# Patient Record
Sex: Female | Born: 1985 | Race: White | Hispanic: No | Marital: Married | Smoking: Never smoker
Health system: Southern US, Community
[De-identification: ages and names within clinical notes are randomized; demographics above are authoritative.]

## PROBLEM LIST (undated history)

## (undated) ENCOUNTER — Emergency Department (HOSPITAL_COMMUNITY): Admission: EM | Payer: Self-pay | Source: Home / Self Care

## (undated) DIAGNOSIS — L0232 Furuncle of buttock: Principal | ICD-10-CM

## (undated) DIAGNOSIS — K831 Obstruction of bile duct: Secondary | ICD-10-CM

## (undated) DIAGNOSIS — Z309 Encounter for contraceptive management, unspecified: Secondary | ICD-10-CM

## (undated) DIAGNOSIS — E785 Hyperlipidemia, unspecified: Secondary | ICD-10-CM

## (undated) DIAGNOSIS — O26643 Intrahepatic cholestasis of pregnancy, third trimester: Secondary | ICD-10-CM

## (undated) HISTORY — DX: Obstruction of bile duct: K83.1

## (undated) HISTORY — DX: Hyperlipidemia, unspecified: E78.5

## (undated) HISTORY — DX: Furuncle of buttock: L02.32

## (undated) HISTORY — DX: Encounter for contraceptive management, unspecified: Z30.9

## (undated) HISTORY — DX: Intrahepatic cholestasis of pregnancy, third trimester: O26.643

## (undated) HISTORY — PX: WISDOM TOOTH EXTRACTION: SHX21

---

## 2012-05-06 ENCOUNTER — Ambulatory Visit (HOSPITAL_COMMUNITY)
Admission: RE | Admit: 2012-05-06 | Discharge: 2012-05-06 | Disposition: A | Discharge status: Home / Self Care | Payer: PRIVATE HEALTH INSURANCE | Source: Ambulatory Visit | Attending: Emergency Medicine | Admitting: Emergency Medicine

## 2012-05-06 ENCOUNTER — Ambulatory Visit (HOSPITAL_COMMUNITY)
Admission: AD | Admit: 2012-05-06 | Discharge: 2012-05-06 | Disposition: A | Discharge status: Home / Self Care | Payer: PRIVATE HEALTH INSURANCE | Source: Ambulatory Visit | Attending: Emergency Medicine | Admitting: Emergency Medicine

## 2012-05-06 ENCOUNTER — Encounter (HOSPITAL_COMMUNITY): Payer: Self-pay | Admitting: *Deleted

## 2012-05-06 ENCOUNTER — Ambulatory Visit (INDEPENDENT_AMBULATORY_CARE_PROVIDER_SITE_OTHER): Payer: PRIVATE HEALTH INSURANCE | Admitting: Emergency Medicine

## 2012-05-06 ENCOUNTER — Encounter (HOSPITAL_COMMUNITY): Payer: Self-pay

## 2012-05-06 VITALS — BP 129/79 | HR 93 | Temp 98.3°F | Resp 18 | Ht 65.5 in | Wt 149.0 lb

## 2012-05-06 DIAGNOSIS — R1031 Right lower quadrant pain: Secondary | ICD-10-CM | POA: Insufficient documentation

## 2012-05-06 DIAGNOSIS — N949 Unspecified condition associated with female genital organs and menstrual cycle: Secondary | ICD-10-CM

## 2012-05-06 DIAGNOSIS — R102 Pelvic and perineal pain: Secondary | ICD-10-CM

## 2012-05-06 LAB — POCT CBC
Hemoglobin: 13.7 g/dL (ref 12.2–16.2)
MCHC: 32.2 g/dL (ref 31.8–35.4)
MID (cbc): 1.7 — AB (ref 0–0.9)
MPV: 10.5 fL (ref 0–99.8)
POC Granulocyte: 11.9 — AB (ref 2–6.9)
POC MID %: 10.6 %M (ref 0–12)
Platelet Count, POC: 296 10*3/uL (ref 142–424)
RBC: 4.57 M/uL (ref 4.04–5.48)

## 2012-05-06 LAB — POCT URINALYSIS DIPSTICK
Blood, UA: NEGATIVE
Ketones, UA: NEGATIVE
Protein, UA: NEGATIVE
Spec Grav, UA: <=1.005
Urobilinogen, UA: 0.2

## 2012-05-06 LAB — POCT UA - MICROSCOPIC ONLY
Bacteria, U Microscopic: NEGATIVE
Casts, Ur, LPF, POC: NEGATIVE
Crystals, Ur, HPF, POC: NEGATIVE
Mucus, UA: NEGATIVE

## 2012-05-06 MED ORDER — IOHEXOL 300 MG/ML  SOLN: 50.0000 mL | Freq: Once | INTRAMUSCULAR | Status: AC | PRN

## 2012-05-06 MED ORDER — IOHEXOL 300 MG/ML  SOLN
100.0000 mL | Freq: Once | INTRAMUSCULAR | Status: AC | PRN
  Administered 2012-05-06: 100 mL via INTRAVENOUS

## 2012-05-06 NOTE — ED Notes (Signed)
Pt states abdominal pain began 130 pm today. Pain in lower right abdominal quadrant. Described as sharp. Denies difficulty voiding, defecating or vaginal discharge. LMP 3 weeks ago. Last BM this morning.

## 2012-05-06 NOTE — Progress Notes (Signed)
[facility] [address], [place] [address] [phone]  Date:  05/06/2012   Name:  Dawn Perez   DOB:  11-22-1985   MRN:  [id number]  PCP:  No primary provider on file.    Chief Complaint: Abdominal Pain   History of Present Illness:  Dawn Perez is a 27 y.o. very pleasant female patient who presents with the following:  Jogging around 1300 and developed sharp pain in her Right lower quadrant that was severe enough that she walked back the 1 1/2 miles to her apartment.  No loss of appetite. No fever or chills.  No nausea or vomiting.  Had some loose stools this morning.  No blood mucous or pus in stools.  No dysuria urgency or frequency.  No vaginal discharge or bleeding.  History of ovarian cysts when teenager and not on OCP.  Pain is less now but persists.  Pain started over umbilical area and is now in the RLQ  There is no problem list on file for this patient.   Past Medical History  Diagnosis Date  . Hyperlipidemia     History reviewed. No pertinent past surgical history.  History  Substance Use Topics  . Smoking status: Never Smoker   . Smokeless tobacco: Not on file  . Alcohol Use: 0.5 oz/week    1 drink(s) per week    Family History  Problem Relation Age of Onset  . Hypertension Mother   . Hyperlipidemia Mother     No Known Allergies  Medication list has been reviewed and updated.  No current outpatient prescriptions on file prior to visit.   No current facility-administered medications on file prior to visit.    Review of Systems:  As per HPI, otherwise negative.    Physical Examination: Filed Vitals:   05/06/12 1746  BP: 129/79  Pulse: 93  Temp: 98.3 F (36.8 C)  Resp: 18   Filed Vitals:   05/06/12 1746  Height: 5' 5.5" (1.664 m)  Weight: 149 lb (67.586 kg)   Body mass index is 24.41 kg/(m^2). Ideal Body Weight: Weight in (lb) to have BMI = 25: 152.2  GEN: WDWN, NAD, Non-toxic, A & O x 3 HEENT:  Atraumatic, Normocephalic. Neck supple. No masses, No LAD. Ears and Nose: No external deformity. CV: RRR, No M/G/R. No JVD. No thrill. No extra heart sounds. PULM: CTA B, no wheezes, crackles, rhonchi. No retractions. No resp. distress. No accessory muscle use. ABD: S, tender right lower quadrant with minimal guarding, ND, +BS. No rebound. No HSM. EXTR: No c/c/e NEURO Normal gait.  PSYCH: Normally interactive. Conversant. Not depressed or anxious appearing.  Calm demeanor.   Pelvic - normal external genitalia, vulva, vagina, cervix, uterus and adnexa   Assessment and Plan: RLQ abdominal pain r/o appendicitis CT scan  Carmelina Dane, MD  Results for orders placed in visit on 05/06/12  POCT UA - MICROSCOPIC ONLY      Result Value Range   WBC, Ur, HPF, POC neg     RBC, urine, microscopic neg     Bacteria, U Microscopic neg     Mucus, UA neg     Epithelial cells, urine per micros 0-2     Crystals, Ur, HPF, POC neg     Casts, Ur, LPF, POC neg     Yeast, UA neg    POCT URINALYSIS DIPSTICK      Result Value Range   Color, UA yellow     Clarity,  UA clear     Glucose, UA neg     Bilirubin, UA neg     Ketones, UA neg     Spec Grav, UA <=1.005     Blood, UA neg     pH, UA 6.0     Protein, UA neg     Urobilinogen, UA 0.2     Nitrite, UA neg     Leukocytes, UA Negative    POCT CBC      Result Value Range   WBC 16.5 (*) 4.6 - 10.2 K/uL   Lymph, poc 2.9  0.6 - 3.4   POC LYMPH PERCENT 17.4  10 - 50 %L   MID (cbc) 1.7 (*) 0 - 0.9   POC MID % 10.6  0 - 12 %M   POC Granulocyte 11.9 (*) 2 - 6.9   Granulocyte percent 72.0  37 - 80 %G   RBC 4.57  4.04 - 5.48 M/uL   Hemoglobin 13.7  12.2 - 16.2 g/dL   HCT, POC 19.1  47.8 - 47.9 %   MCV 93.3  80 - 97 fL   MCH, POC 30.0  27 - 31.2 pg   MCHC 32.2  31.8 - 35.4 g/dL   RDW, POC 29.5     Platelet Count, POC 296  142 - 424 K/uL   MPV 10.5  0 - 99.8 fL

## 2012-05-06 NOTE — ED Notes (Signed)
Pt seen at urgent care. They checked CBC and found elevated wbc. And performed pelvic exam.

## 2012-05-06 NOTE — Addendum Note (Signed)
Addended by: Carollee Leitz L on: 05/06/2012 07:05 PM   Modules accepted: Orders

## 2012-05-07 ENCOUNTER — Telehealth: Payer: Self-pay | Admitting: *Deleted

## 2012-05-07 ENCOUNTER — Ambulatory Visit (HOSPITAL_COMMUNITY)
Admission: RE | Admit: 2012-05-07 | Discharge: 2012-05-07 | Disposition: A | Discharge status: Home / Self Care | Payer: PRIVATE HEALTH INSURANCE | Source: Ambulatory Visit | Attending: Emergency Medicine | Admitting: Emergency Medicine

## 2012-05-07 ENCOUNTER — Ambulatory Visit (INDEPENDENT_AMBULATORY_CARE_PROVIDER_SITE_OTHER): Payer: PRIVATE HEALTH INSURANCE | Admitting: Emergency Medicine

## 2012-05-07 VITALS — BP 116/80 | HR 62 | Temp 98.4°F | Resp 14 | Ht 66.0 in | Wt 148.2 lb

## 2012-05-07 DIAGNOSIS — R109 Unspecified abdominal pain: Secondary | ICD-10-CM

## 2012-05-07 DIAGNOSIS — N949 Unspecified condition associated with female genital organs and menstrual cycle: Secondary | ICD-10-CM | POA: Insufficient documentation

## 2012-05-07 LAB — POCT CBC
HCT, POC: 43.4 % (ref 37.7–47.9)
Hemoglobin: 13.9 g/dL (ref 12.2–16.2)
Lymph, poc: 2.2 (ref 0.6–3.4)
MCHC: 32 g/dL (ref 31.8–35.4)
MPV: 10.1 fL (ref 0–99.8)
POC Granulocyte: 6.1 (ref 2–6.9)
POC MID %: 15.9 %M — AB (ref 0–12)
RBC: 4.69 M/uL (ref 4.04–5.48)
WBC: 9.8 10*3/uL (ref 4.6–10.2)

## 2012-05-07 NOTE — Patient Instructions (Addendum)
March 6th thursday 8:40 call 24 hrs in advance if patient needs to cancel [address]

## 2012-05-07 NOTE — Telephone Encounter (Signed)
Called patient to advise her the Ultrasound was normal and if she does not get better return to the clinic. Also, keep the appointment with Dr Audie Box on Thursday, per Dr Dareen Piano.

## 2012-05-07 NOTE — Progress Notes (Signed)
[facility] [address] [place] [address] [phone]  Date:  05/07/2012   Name:  Dawn Perez   DOB:  03/21/1985   MRN:  [id number]  PCP:  No primary provider on file.    Chief Complaint: Abdominal Pain   History of Present Illness:  Dawn Perez is a 27 y.o. very pleasant female patient who presents with the following:  Jogging around 1300 yesterday and developed sharp pain in her Right lower quadrant that was severe enough that she walked back the 1 1/2 miles to her apartment. No loss of appetite. No fever or chills. No nausea or vomiting. Had some loose stools this morning. No blood mucous or pus in stools. No dysuria urgency or frequency. No vaginal discharge or bleeding. History of ovarian cysts when teenager and not on OCP. Pain is less now today but persists. Pain started over umbilical area and is now in the RLQ.  Had a negative pelvic and a negative CT abdomen and pelvis.     There is no problem list on file for this patient.   Past Medical History  Diagnosis Date  . Hyperlipidemia     History reviewed. No pertinent past surgical history.  History  Substance Use Topics  . Smoking status: Never Smoker   . Smokeless tobacco: Not on file  . Alcohol Use: 0.5 oz/week    1 drink(s) per week    Family History  Problem Relation Age of Onset  . Hypertension Mother   . Hyperlipidemia Mother     No Known Allergies  Medication list has been reviewed and updated.  Current Outpatient Prescriptions on File Prior to Visit  Medication Sig Dispense Refill  . norgestimate-ethinyl estradiol (ORTHO-CYCLEN,SPRINTEC,PREVIFEM) 0.25-35 MG-MCG tablet Take 1 tablet by mouth daily.       No current facility-administered medications on file prior to visit.    Review of Systems:  As per HPI, otherwise negative.    Physical Examination: Filed Vitals:   05/07/12 1225  BP: 116/80  Pulse: 62  Temp: 98.4 F (36.9 C)  Resp: 14   Filed Vitals:   05/07/12 1225  Height: 5\' 6"  (1.676 m)  Weight: 148 lb 3.2 oz (67.223 kg)   Body mass index is 23.93 kg/(m^2). Ideal Body Weight: Weight in (lb) to have BMI = 25: 154.6  GEN: WDWN, NAD, Non-toxic, A & O x 3 HEENT: Atraumatic, Normocephalic. Neck supple. No masses, No LAD. Ears and Nose: No external deformity. CV: RRR, No M/G/R. No JVD. No thrill. No extra heart sounds. PULM: CTA B, no wheezes, crackles, rhonchi. No retractions. No resp. distress. No accessory muscle use. ABD: S, tender right lower quadrant, ND, +BS. No rebound. No HSM. EXTR: No c/c/e NEURO Normal gait.  PSYCH: Normally interactive. Conversant. Not depressed or anxious appearing.  Calm demeanor.    Assessment and Plan: Abdominal pain  Carmelina Dane, MD  Results for orders placed in visit on 05/07/12  POCT CBC      Result Value Range   WBC 9.8  4.6 - 10.2 K/uL   Lymph, poc 2.2  0.6 - 3.4   POC LYMPH PERCENT 22.1  10 - 50 %L   MID (cbc) 1.6 (*) 0 - 0.9   POC MID % 15.9 (*) 0 - 12 %M   POC Granulocyte 6.1  2 - 6.9   Granulocyte percent 62.0  37 - 80 %G   RBC 4.69  4.04 - 5.48 M/uL   Hemoglobin 13.9  12.2 - 16.2 g/dL   HCT, POC 16.1  09.6 - 47.9 %   MCV 92.6  80 - 97 fL   MCH, POC 29.6  27 - 31.2 pg   MCHC 32.0  31.8 - 35.4 g/dL   RDW, POC 04.5     Platelet Count, POC 299  142 - 424 K/uL   MPV 10.1  0 - 99.8 fL

## 2012-05-08 ENCOUNTER — Telehealth: Payer: Self-pay | Admitting: Family Medicine

## 2012-05-08 LAB — GC/CHLAMYDIA PROBE AMP: CT Probe RNA: NEGATIVE

## 2012-05-10 ENCOUNTER — Ambulatory Visit: Payer: PRIVATE HEALTH INSURANCE | Admitting: Gynecology

## 2012-07-24 NOTE — Telephone Encounter (Signed)
close

## 2013-04-08 ENCOUNTER — Encounter: Payer: Self-pay | Admitting: Adult Health

## 2013-04-08 ENCOUNTER — Other Ambulatory Visit (HOSPITAL_COMMUNITY)
Admission: RE | Admit: 2013-04-08 | Discharge: 2013-04-08 | Disposition: A | Discharge status: Home / Self Care | Payer: PRIVATE HEALTH INSURANCE | Source: Ambulatory Visit | Attending: Obstetrics and Gynecology | Admitting: Obstetrics and Gynecology

## 2013-04-08 ENCOUNTER — Ambulatory Visit (INDEPENDENT_AMBULATORY_CARE_PROVIDER_SITE_OTHER): Payer: PRIVATE HEALTH INSURANCE | Admitting: Adult Health

## 2013-04-08 ENCOUNTER — Encounter (INDEPENDENT_AMBULATORY_CARE_PROVIDER_SITE_OTHER): Payer: Self-pay

## 2013-04-08 VITALS — BP 120/72 | HR 72 | Ht 66.0 in | Wt 153.0 lb

## 2013-04-08 DIAGNOSIS — Z309 Encounter for contraceptive management, unspecified: Secondary | ICD-10-CM | POA: Insufficient documentation

## 2013-04-08 DIAGNOSIS — Z01419 Encounter for gynecological examination (general) (routine) without abnormal findings: Secondary | ICD-10-CM

## 2013-04-08 DIAGNOSIS — Z3200 Encounter for pregnancy test, result unknown: Secondary | ICD-10-CM

## 2013-04-08 DIAGNOSIS — Z3202 Encounter for pregnancy test, result negative: Secondary | ICD-10-CM

## 2013-04-08 LAB — POCT URINE PREGNANCY: PREG TEST UR: NEGATIVE

## 2013-04-08 MED ORDER — NORETHIN-ETH ESTRAD-FE BIPHAS 1 MG-10 MCG / 10 MCG PO TABS: 1.0000 | ORAL_TABLET | Freq: Every day | ORAL | Status: DC

## 2013-04-08 NOTE — Progress Notes (Signed)
[name], female   DOB: 01/28/1986, 28 y.o.   MRN: [id number] History of Present Illness:  Dawn Perez is a 28 year old white female single in for a pap and physical.wnats to try some birth control, has ben using condoms.  Current Medications, Allergies, Past Medical History, Past Surgical History, Family History and Social History were reviewed in [facility] Link electronic medical record.     Review of Systems: Patient denies any headaches, blurred vision, shortness of breath, chest pain, abdominal pain, problems with bowel movements, urination, or intercourse. No joint pain or mood swings.     Physical Exam:BP 120/72  Pulse 72  Ht 5\' 6"  (1.676 m)  Wt 153 lb (69.4 kg)  BMI 24.71 kg/m2  LMP 01/15/2015UPT negative General:  Well developed, well nourished, no acute distress Skin:  Warm and dry Neck:  Midline trachea, normal thyroid Lungs; Clear to auscultation bilaterally Breast:  No dominant palpable mass, retraction, or nipple discharge Cardiovascular: Regular rate and rhythm Abdomen:  Soft, non tender, no hepatosplenomegaly Pelvic:  External genitalia is normal in appearance.  The vagina is normal in appearance.  The cervix is smooth.  Uterus is felt to be normal size, shape, and contour.  No adnexal masses or tenderness noted. 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   Impression: Yearly gyn exam Contraceptive management History of elevated cholesterol   Plan: Trial  Loloestin Number of samples 4 Lot number [id number]   Exp date 1/16 Start with next cycle, use condoms Follow up in 3 months and will get fasting labs Physical in 1 year

## 2013-04-08 NOTE — Patient Instructions (Signed)
Try lo loestrin  Oral Contraception Use Oral contraceptive pills (OCPs) are medicines taken to prevent pregnancy. OCPs work by preventing the ovaries from releasing eggs. The hormones in OCPs also cause the cervical mucus to thicken, preventing the sperm from entering the uterus. The hormones also cause the uterine lining to become thin, not allowing a fertilized egg to attach to the inside of the uterus. OCPs are highly effective when taken exactly as prescribed. However, OCPs do not prevent sexually transmitted diseases (STDs). Safe sex practices, such as using condoms along with an OCP, can help prevent STDs. Before taking OCPs, you may have a physical exam and Pap test. Your health care provider may also order blood tests if necessary. Your health care provider will make sure you are a good candidate for oral contraception. Discuss with your health care provider the possible side effects of the OCP you may be prescribed. When starting an OCP, it can take 2 to 3 months for the body to adjust to the changes in hormone levels in your body.  HOW TO TAKE ORAL CONTRACEPTIVE PILLS Your health care provider may advise you on how to start taking the first cycle of OCPs. Otherwise, you can:   Start on day 1 of your menstrual period. You will not need any backup contraceptive protection with this start time.   Start on the first Sunday after your menstrual period or the day you get your prescription. In these cases, you will need to use backup contraceptive protection for the first week.   Start the pill at any time of your cycle. If you take the pill within 5 days of the start of your period, you are protected against pregnancy right away. In this case, you will not need a backup form of birth control. If you start at any other time of your menstrual cycle, you will need to use another form of birth control for 7 days. If your OCP is the type called a minipill, it will protect you from pregnancy after taking  it for 2 days (48 hours). After you have started taking OCPs:   If you forget to take 1 pill, take it as soon as you remember. Take the next pill at the regular time.   If you miss 2 or more pills, call your health care provider because different pills have different instructions for missed doses. Use backup birth control until your next menstrual period starts.   If you use a 28-day pack that contains inactive pills and you miss 1 of the last 7 pills (pills with no hormones), it will not matter. Throw away the rest of the nonhormone pills and start a new pill pack.  No matter which day you start the OCP, you will always start a new pack on that same day of the week. Have an extra pack of OCPs and a backup contraceptive method available in case you miss some pills or lose your OCP pack.  HOME CARE INSTRUCTIONS   Do not smoke.   Always use a condom to protect against STDs. OCPs do not protect against STDs.   Use a calendar to mark your menstrual period days.   Read the information and directions that came with your OCP. Talk to your health care provider if you have questions.  SEEK MEDICAL CARE IF:   You develop nausea and vomiting.   You have abnormal vaginal discharge or bleeding.   You develop a rash.   You miss your menstrual period.  You are losing your hair.   You need treatment for mood swings or depression.   You get dizzy when taking the OCP.   You develop acne from taking the OCP.   You become pregnant.  SEEK IMMEDIATE MEDICAL CARE IF:   You develop chest pain.   You develop shortness of breath.   You have an uncontrolled or severe headache.   You develop numbness or slurred speech.   You develop visual problems.   You develop pain, redness, and swelling in the legs.  Document Released: 02/10/2011 Document Revised: 10/24/2012 Document Reviewed: 08/12/2012 [facility] Patient Information 2014 [place], [place]. Follow up in 3 months for  F/U and labs

## 2013-05-23 ENCOUNTER — Ambulatory Visit: Payer: PRIVATE HEALTH INSURANCE | Admitting: Adult Health

## 2013-05-23 ENCOUNTER — Telehealth: Payer: Self-pay | Admitting: Adult Health

## 2013-05-23 NOTE — Telephone Encounter (Signed)
Pt informed WNL pap from 04/08/2013. Pt c/o "bump" vaginal area, called transferred to front staff for an appt.

## 2013-05-29 ENCOUNTER — Encounter: Payer: Self-pay | Admitting: Advanced Practice Midwife

## 2013-05-29 ENCOUNTER — Ambulatory Visit (INDEPENDENT_AMBULATORY_CARE_PROVIDER_SITE_OTHER): Payer: PRIVATE HEALTH INSURANCE | Admitting: Advanced Practice Midwife

## 2013-05-29 ENCOUNTER — Encounter (INDEPENDENT_AMBULATORY_CARE_PROVIDER_SITE_OTHER): Payer: Self-pay

## 2013-05-29 VITALS — BP 100/70 | Ht 66.0 in | Wt 154.0 lb

## 2013-05-29 DIAGNOSIS — N764 Abscess of vulva: Secondary | ICD-10-CM

## 2013-05-29 NOTE — Progress Notes (Signed)
Dawn PaisBethany Perez 28 y.o.  Filed Vitals:   05/29/13 1613  BP: 100/70   Past Medical History  Diagnosis Date  . Hyperlipidemia   . Contraceptive management 04/08/2013   History reviewed. No pertinent past surgical history. family history includes Autoimmune disease (age of onset: 5630) in her father; Cancer in her other; Heart disease in her maternal grandfather; Hyperlipidemia in her mother; Hypertension in her mother; Stroke in her maternal grandfather. Current outpatient prescriptions:Norethindrone-Ethinyl Estradiol-Fe Biphas (LO LOESTRIN FE) 1 MG-10 MCG / 10 MCG tablet, Take 1 tablet by mouth daily., Disp: 4 Package, Rfl: 0  HPI:  Started on LoLestrin a month ago.  Fine so far.  Last week noticed a bump on vagina.  It came to a head, white pus came out of it, and now it's gone.  PE: No evidence of furnuncle or otherwise ASSESSMENT:probably furuncle, resolved  PLAN:dicussed loofa, dial soap

## 2013-07-09 ENCOUNTER — Ambulatory Visit (INDEPENDENT_AMBULATORY_CARE_PROVIDER_SITE_OTHER): Payer: PRIVATE HEALTH INSURANCE | Admitting: Adult Health

## 2013-07-09 ENCOUNTER — Encounter: Payer: Self-pay | Admitting: Adult Health

## 2013-07-09 VITALS — BP 110/72 | Ht 66.0 in | Wt 157.0 lb

## 2013-07-09 DIAGNOSIS — Z139 Encounter for screening, unspecified: Secondary | ICD-10-CM

## 2013-07-09 DIAGNOSIS — Z3009 Encounter for other general counseling and advice on contraception: Secondary | ICD-10-CM

## 2013-07-09 DIAGNOSIS — Z309 Encounter for contraceptive management, unspecified: Secondary | ICD-10-CM

## 2013-07-09 LAB — COMPREHENSIVE METABOLIC PANEL
ALT: 11 U/L (ref 0–35)
AST: 15 U/L (ref 0–37)
Albumin: 4.1 g/dL (ref 3.5–5.2)
Alkaline Phosphatase: 58 U/L (ref 39–117)
BILIRUBIN TOTAL: 0.4 mg/dL (ref 0.2–1.2)
BUN: 16 mg/dL (ref 6–23)
CO2: 25 mEq/L (ref 19–32)
Calcium: 9.7 mg/dL (ref 8.4–10.5)
Chloride: 105 mEq/L (ref 96–112)
Creat: 0.98 mg/dL (ref 0.50–1.10)
GLUCOSE: 69 mg/dL — AB (ref 70–99)
Potassium: 4.3 mEq/L (ref 3.5–5.3)
Sodium: 137 mEq/L (ref 135–145)
Total Protein: 6.9 g/dL (ref 6.0–8.3)

## 2013-07-09 LAB — LIPID PANEL
CHOL/HDL RATIO: 2.3 ratio
CHOLESTEROL: 177 mg/dL (ref 0–200)
HDL: 77 mg/dL (ref >39)
LDL Cholesterol: 89 mg/dL (ref 0–99)
Triglycerides: 54 mg/dL (ref <150)
VLDL: 11 mg/dL (ref 0–40)

## 2013-07-09 LAB — CBC
HCT: 39.7 % (ref 36.0–46.0)
HEMOGLOBIN: 13.5 g/dL (ref 12.0–15.0)
MCH: 30.8 pg (ref 26.0–34.0)
MCHC: 34 g/dL (ref 30.0–36.0)
MCV: 90.6 fL (ref 78.0–100.0)
Platelets: 259 10*3/uL (ref 150–400)
RBC: 4.38 MIL/uL (ref 3.87–5.11)
RDW: 13.2 % (ref 11.5–15.5)
WBC: 6.7 10*3/uL (ref 4.0–10.5)

## 2013-07-09 LAB — TSH: TSH: 1.404 u[IU]/mL (ref 0.350–4.500)

## 2013-07-09 MED ORDER — NORETHIN-ETH ESTRAD-FE BIPHAS 1 MG-10 MCG / 10 MCG PO TABS: 1.0000 | ORAL_TABLET | Freq: Every day | ORAL | Status: DC

## 2013-07-09 NOTE — Progress Notes (Signed)
Subjective:     [name], female   DOB: 09/20/1985, 28 y.o.   MRN: [id number]  HPI [name] is a 28 year old white female back today after starting OCs and is doing good, is not having a period.  Review of Systems See HPI Reviewed past medical,surgical, social and family history. Reviewed medications and allergies.     Objective:   Physical Exam BP 110/72  Ht 5\' 6"  (1.676 m)  Wt 157 lb (71.215 kg)  BMI 25.35 kg/m2   Likes the pills, is not having a period on  Lo loestrin,, was supposed to be fasting for labs but ate this am will go ahead and check anyway.  Assessment:     Contraceptive management   Screening labs  Plan:     Refilled lo loestrin x 1 year,discount card given Check CBC,CMP,TSH and lipids,she is not fasting Return in 1 year

## 2013-07-09 NOTE — Patient Instructions (Signed)
Follow up in 1 year.

## 2013-07-10 ENCOUNTER — Telehealth: Payer: Self-pay | Admitting: Adult Health

## 2013-07-10 NOTE — Telephone Encounter (Signed)
Left message labs normal will mail copy to pt.

## 2013-11-04 ENCOUNTER — Telehealth: Payer: Self-pay | Admitting: Adult Health

## 2013-11-04 MED ORDER — METRONIDAZOLE 0.75 % VA GEL: VAGINAL | Status: DC

## 2013-11-04 NOTE — Telephone Encounter (Signed)
Left message I called 

## 2013-11-04 NOTE — Telephone Encounter (Signed)
Complains of fishy odor, has changed condoms will try metrogel

## 2013-11-04 NOTE — Addendum Note (Signed)
Addended by: Cyril Mourning A on: 11/04/2013 11:58 AM   Modules accepted: Orders

## 2013-12-18 ENCOUNTER — Ambulatory Visit (INDEPENDENT_AMBULATORY_CARE_PROVIDER_SITE_OTHER): Payer: PRIVATE HEALTH INSURANCE | Admitting: Advanced Practice Midwife

## 2013-12-18 ENCOUNTER — Encounter: Payer: Self-pay | Admitting: Advanced Practice Midwife

## 2013-12-18 VITALS — BP 120/80 | Ht 66.0 in | Wt 155.0 lb

## 2013-12-18 DIAGNOSIS — N63 Unspecified lump in unspecified breast: Secondary | ICD-10-CM | POA: Insufficient documentation

## 2013-12-18 NOTE — Progress Notes (Signed)
[facility] Visit  Patient name: Dawn Perez MRN [id number]  Date of birth: 10/02/1985  CC & HPI:  Dawn Perez is a 28 y.o. Caucasian female presenting today for Left breast nodule. She just noticed it yesterday.  Sl tender  Pertinent History Reviewed:  Medical & Surgical Hx:   Past Medical History  Diagnosis Date  . Hyperlipidemia   . Contraceptive management 04/08/2013   History reviewed. No pertinent past surgical history. Medications: Reviewed & Updated - see associated section Social History: Reviewed -  reports that she has never smoked. She has never used smokeless tobacco.  Objective Findings:  Vitals: BP 120/80  Ht 5\' 6"  (1.676 m)  Wt 155 lb (70.308 kg)  BMI 25.03 kg/m2  LMP 12/10/2013  Physical Examination: General appearance - alert, well appearing, and in no distress Mental status - alert, oriented to person, place, and time Breasts - Left breast has a 2cm X 1 cm mobile, soft, sl tender nodule 3 o'clock outer quad.  No results found for this or any previous visit (from the past 24 hour(s)).   Assessment & Plan:  A:   Breast nodule, suspect cyst P:  Pt works at [employer], so would like us there.  Rx for Left breast us with possible mammogram given to pt   CRESENZO-DISHMAN,Shelaine Frie CNM 12/18/2013 4:16 PM

## 2014-07-09 ENCOUNTER — Encounter: Payer: Self-pay | Admitting: Adult Health

## 2014-07-09 ENCOUNTER — Ambulatory Visit (INDEPENDENT_AMBULATORY_CARE_PROVIDER_SITE_OTHER): Payer: PRIVATE HEALTH INSURANCE | Admitting: Adult Health

## 2014-07-09 VITALS — BP 130/62 | HR 72 | Ht 64.5 in | Wt 154.5 lb

## 2014-07-09 DIAGNOSIS — Z3041 Encounter for surveillance of contraceptive pills: Secondary | ICD-10-CM

## 2014-07-09 DIAGNOSIS — N898 Other specified noninflammatory disorders of vagina: Secondary | ICD-10-CM

## 2014-07-09 DIAGNOSIS — Z01419 Encounter for gynecological examination (general) (routine) without abnormal findings: Secondary | ICD-10-CM | POA: Diagnosis not present

## 2014-07-09 MED ORDER — NYSTATIN-TRIAMCINOLONE 100000-0.1 UNIT/GM-% EX OINT: 1.0000 "application " | TOPICAL_OINTMENT | Freq: Two times a day (BID) | CUTANEOUS | Status: DC

## 2014-07-09 MED ORDER — NORETHIN-ETH ESTRAD-FE BIPHAS 1 MG-10 MCG / 10 MCG PO TABS: 1.0000 | ORAL_TABLET | Freq: Every day | ORAL | Status: DC

## 2014-07-09 NOTE — Patient Instructions (Signed)
Return in 4 weeks for recheck Pap and physical in 1 year

## 2014-07-09 NOTE — Progress Notes (Signed)
[name], female   DOB: 10/27/1985, 29 y.o.   MRN: [id number] History of Present Illness:  Dawn Perez is a 29 year old white female, in for well woman gyn exam.She had a normal pap 04/08/13.She complains of area in vagina for about a week. She works in [employer] at [employer].  Current Medications, Allergies, Past Medical History, Past Surgical History, Family History and Social History were reviewed in [facility] Link electronic medical record.     Review of Systems: Patient denies any headaches, hearing loss, fatigue, blurred vision, shortness of breath, chest pain, abdominal pain, problems with bowel movements, urination, or intercourse. No joint pain or mood swings.+ area in vagina of concern for wart.Had gardisil.    Physical Exam:BP 130/62 mmHg  Pulse 72  Ht 5' 4.5" (1.638 m)  Wt 154 lb 8 oz (70.081 kg)  BMI 26.12 kg/m2 General:  Well developed, well nourished, no acute distress Skin:  Warm and dry Neck:  Midline trachea, normal thyroid, good ROM, no lymphadenopathy Lungs; Clear to auscultation bilaterally Breast:  No dominant palpable mass, retraction, or nipple discharge Cardiovascular: Regular rate and rhythm Abdomen:  Soft, non tender, no hepatosplenomegaly Pelvic:  External genitalia is normal in appearance, except at introitus has increased rugae and puffiness.Dr Emelda FearFerguson in for co exam.  The vagina is normal in appearance. Urethra has no lesions or masses. The cervix is smooth.  Uterus is felt to be normal size, shape, and contour.  No adnexal masses or tenderness noted.Bladder is non tender, no masses felt. Extremities/musculoskeletal:  No swelling or varicosities noted, no clubbing or cyanosis Psych:  No mood changes, alert and cooperative,seems happy Discussed area looks irritated, but no obvious wart, will try mycolog and recheck.Not shave that area and go back to old detergent.  Impression: Well woman gyn exam no pap Contraceptive management Vaginal irritation      Plan: Rx mycolog use 2 x daily with 1 refill Recheck area in 4 weeks, if not better may get biopsy Refilled lo loestrin x 1 year, discount card given and 2 sample packs lot [id number] A exp 1/17 Pap and physical in 1 year

## 2014-08-06 ENCOUNTER — Encounter: Payer: Self-pay | Admitting: Adult Health

## 2014-08-06 ENCOUNTER — Ambulatory Visit (INDEPENDENT_AMBULATORY_CARE_PROVIDER_SITE_OTHER): Payer: PRIVATE HEALTH INSURANCE | Admitting: Adult Health

## 2014-08-06 VITALS — BP 100/78 | HR 64 | Ht 64.0 in | Wt 155.0 lb

## 2014-08-06 DIAGNOSIS — N898 Other specified noninflammatory disorders of vagina: Secondary | ICD-10-CM | POA: Diagnosis not present

## 2014-08-06 DIAGNOSIS — L0232 Furuncle of buttock: Secondary | ICD-10-CM

## 2014-08-06 HISTORY — DX: Furuncle of buttock: L02.32

## 2014-08-06 MED ORDER — SULFAMETHOXAZOLE-TRIMETHOPRIM 800-160 MG PO TABS: 1.0000 | ORAL_TABLET | Freq: Two times a day (BID) | ORAL | Status: DC

## 2014-08-06 MED ORDER — FLUCONAZOLE 150 MG PO TABS: 150.0000 mg | ORAL_TABLET | Freq: Once | ORAL | Status: DC

## 2014-08-06 NOTE — Patient Instructions (Signed)
Abscess An abscess is an infected area that contains a collection of pus and debris.It can occur in almost any part of the body. An abscess is also known as a furuncle or boil. CAUSES  An abscess occurs when tissue gets infected. This can occur from blockage of oil or sweat glands, infection of hair follicles, or a minor injury to the skin. As the body tries to fight the infection, pus collects in the area and creates pressure under the skin. This pressure causes pain. People with weakened immune systems have difficulty fighting infections and get certain abscesses more often.  SYMPTOMS Usually an abscess develops on the skin and becomes a painful mass that is red, warm, and tender. If the abscess forms under the skin, you may feel a moveable soft area under the skin. Some abscesses break open (rupture) on their own, but most will continue to get worse without care. The infection can spread deeper into the body and eventually into the bloodstream, causing you to feel ill.  DIAGNOSIS  Your caregiver will take your medical history and perform a physical exam. A sample of fluid may also be taken from the abscess to determine what is causing your infection. TREATMENT  Your caregiver may prescribe antibiotic medicines to fight the infection. However, taking antibiotics alone usually does not cure an abscess. Your caregiver may need to make a small cut (incision) in the abscess to drain the pus. In some cases, gauze is packed into the abscess to reduce pain and to continue draining the area. HOME CARE INSTRUCTIONS   Only take over-the-counter or prescription medicines for pain, discomfort, or fever as directed by your caregiver.  If you were prescribed antibiotics, take them as directed. Finish them even if you start to feel better.  If gauze is used, follow your caregiver's directions for changing the gauze.  To avoid spreading the infection:  Keep your draining abscess covered with a  bandage.  Wash your hands well.  Do not share personal care items, towels, or whirlpools with others.  Avoid skin contact with others.  Keep your skin and clothes clean around the abscess.  Keep all follow-up appointments as directed by your caregiver. SEEK MEDICAL CARE IF:   You have increased pain, swelling, redness, fluid drainage, or bleeding.  You have muscle aches, chills, or a general ill feeling.  You have a fever. MAKE SURE YOU:   Understand these instructions.  Will watch your condition.  Will get help right away if you are not doing well or get worse. Document Released: 12/01/2004 Document Revised: 08/23/2011 Document Reviewed: 05/06/2011 [facility] Patient Information 2015 [place], [place]. This information is not intended to replace advice given to you by your health care provider. Make sure you discuss any questions you have with your health care provider. Take septra ds 1 bid  X 10-14 days Use mycolog prn Follow up prn

## 2014-08-06 NOTE — Progress Notes (Signed)
Subjective:     [name], female   DOB: 02/21/1986, 29 y.o.   MRN: [id number]  HPI Dawn Perez is a 29 year old white female in for follow up of vaginal irritation and it is better, now has bump right buttock, has not shaved lately.  Review of Systems  Patient denies any headaches, hearing loss, fatigue, blurred vision, shortness of breath, chest pain, abdominal pain, problems with bowel movements, urination, or intercourse. No joint pain or mood swings.See HPI for positives. Reviewed past medical,surgical, social and family history. Reviewed medications and allergies.     Objective:   Physical Exam BP 100/78 mmHg  Pulse 64  Ht 5\' 4"  (1.626 m)  Wt 155 lb (70.308 kg)  BMI 26.59 kg/m2Skin warm and dry.   Area in vagina has resolved, no puffiness now, but has 2 cm boil right buttock, mildly red and tender if touched but skin intact, will rx antibiotic.  Assessment:     Boil on buttock Vaginal irritation resolved    Plan:    Return if boil does not resolve and prn Use mycolog prn Rx septra ds 1 bid x 10- 14 days Rx diflucan 150 mg #1 take 1 now with 1 refill Use warm compress No not squeeze Review handout on boils

## 2014-08-15 IMAGING — US US PELVIS COMPLETE
1 series · 14 of 25 positions shown · non-contrast
Comparison: 05/06/2012

CLINICAL DATA: Pelvic pain



[Series 1: us pelvis complete · 0.30mm/px · 46 acquisitions, 14 frames shown]
[im 1/46]
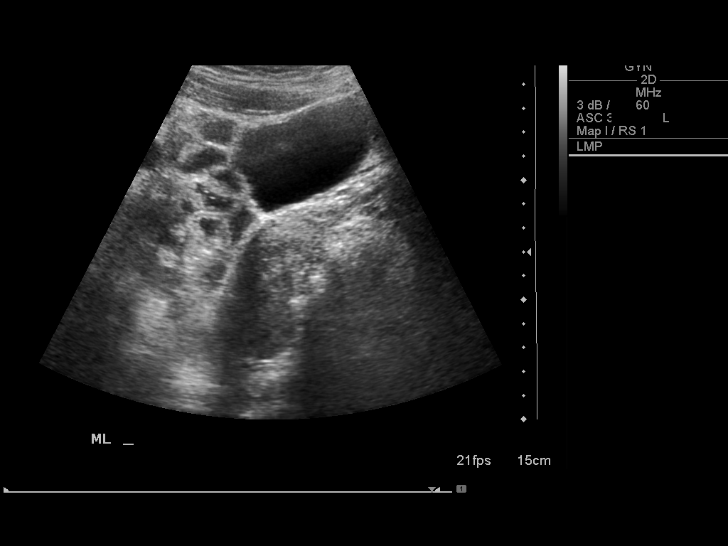
[im 4/46]
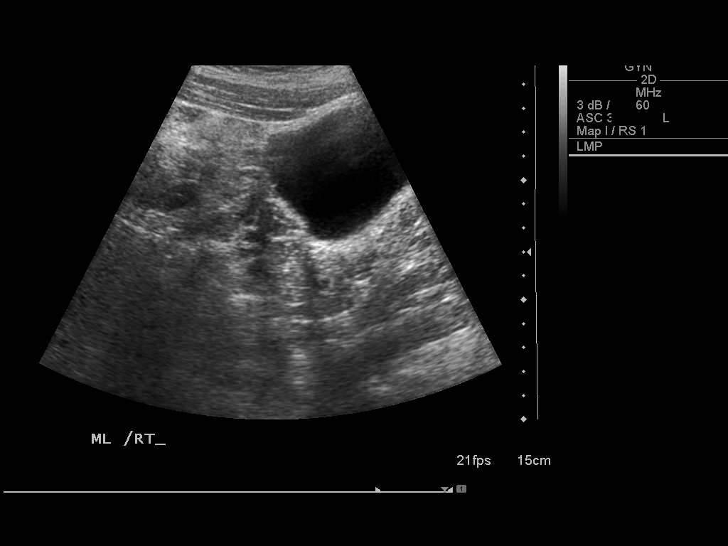
[im 8/46]
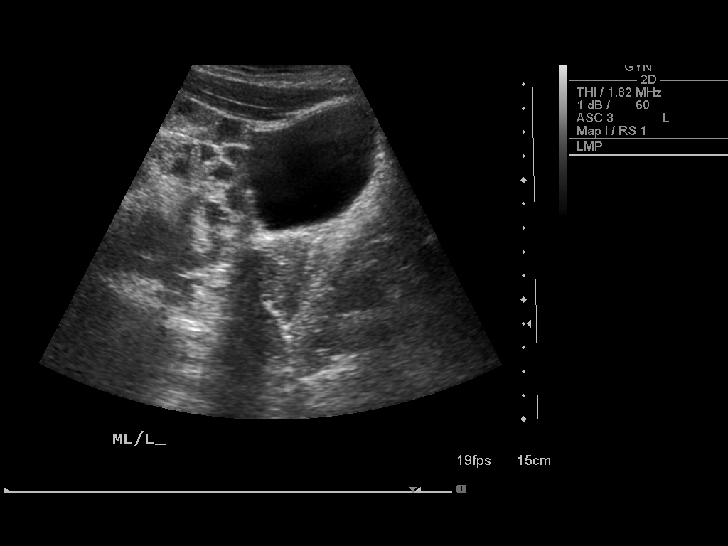
[im 12/46]
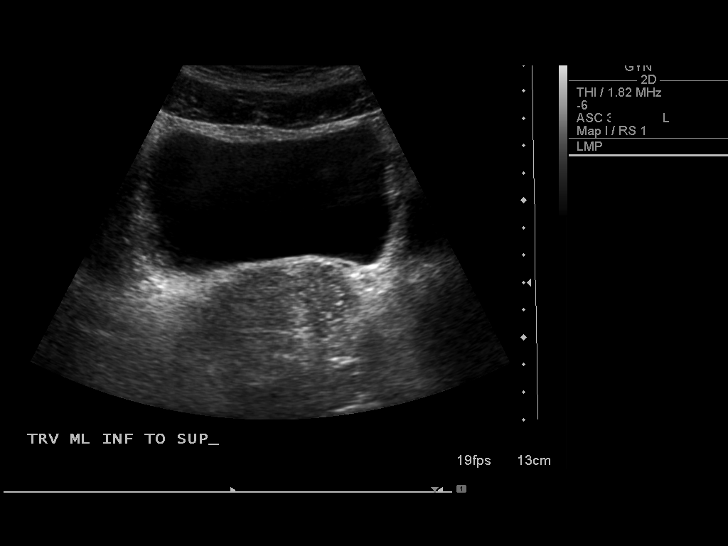
[im 16/46]
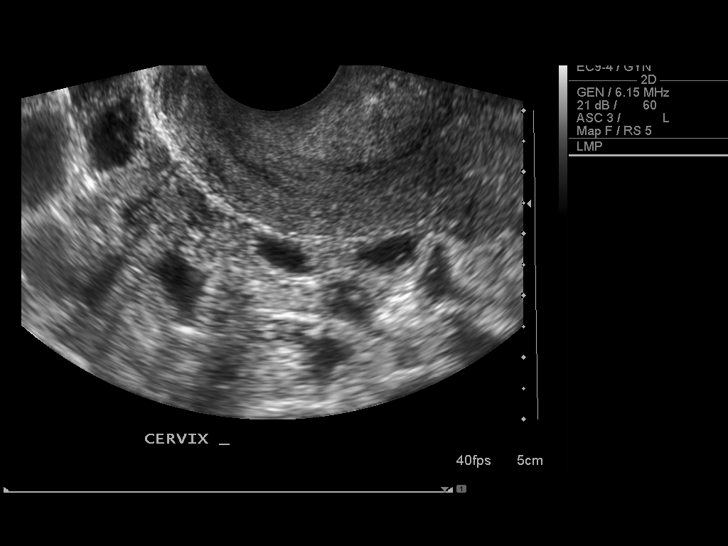
[im 17/46]
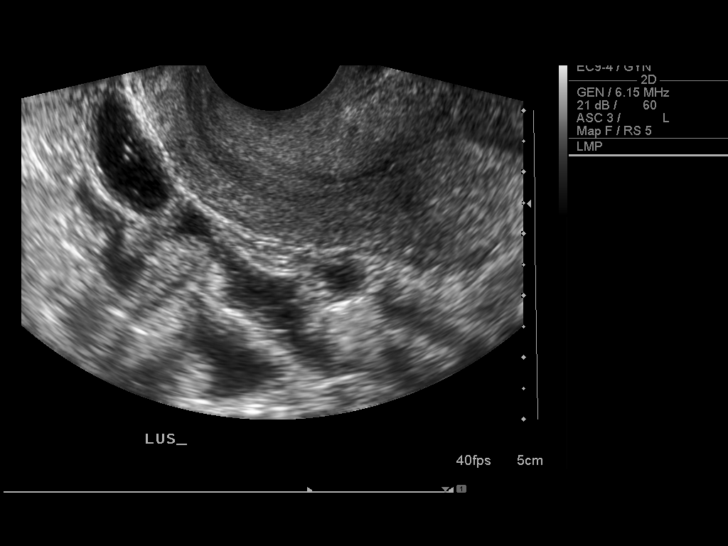
[im 21/46]
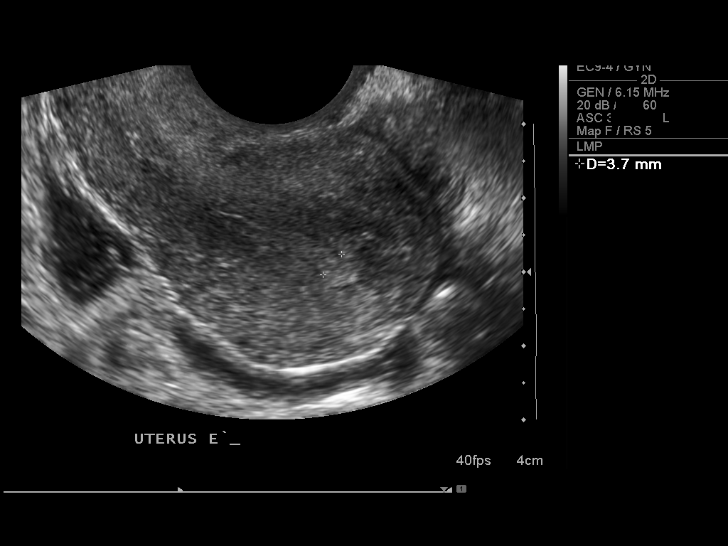
[im 25/46]
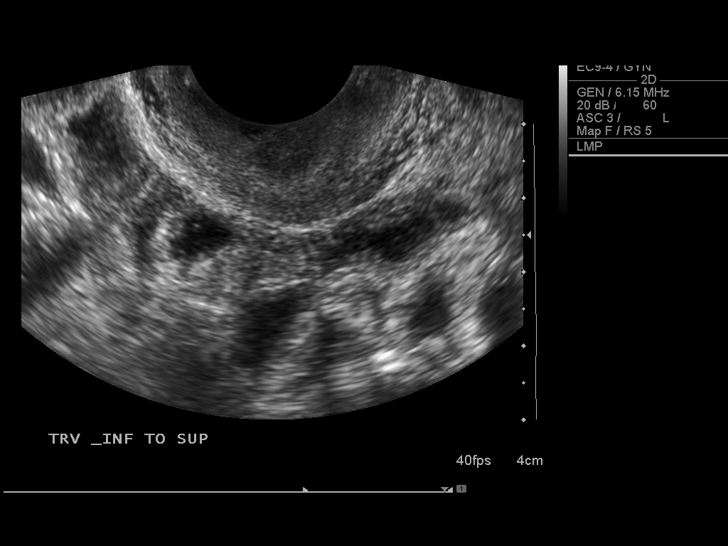
[im 29/46]
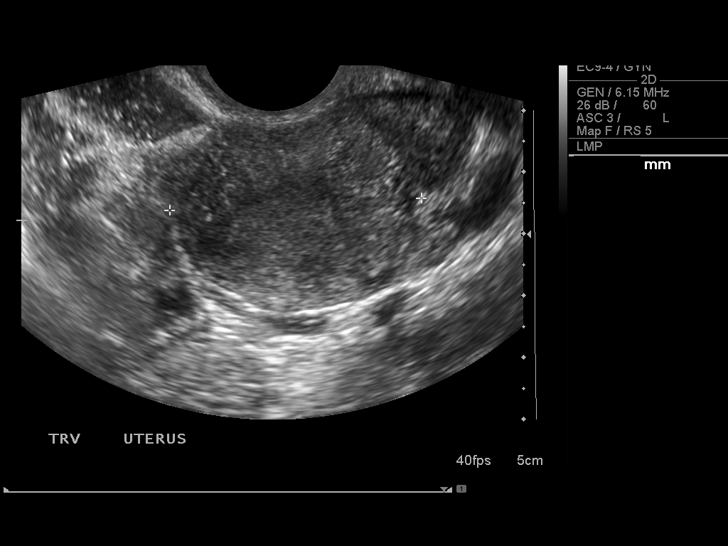
[im 31/46]
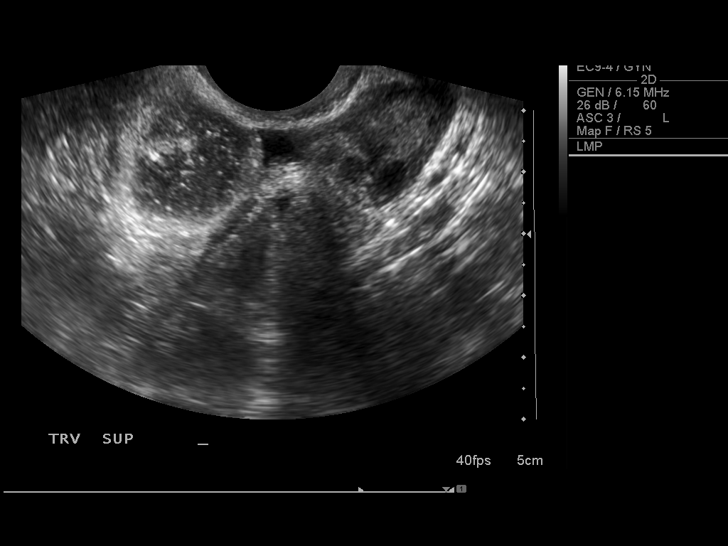
[im 34/46]
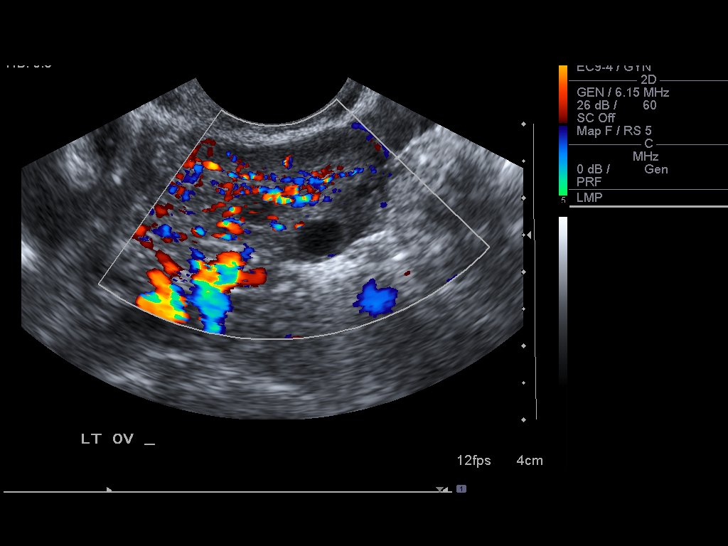
[im 38/46]
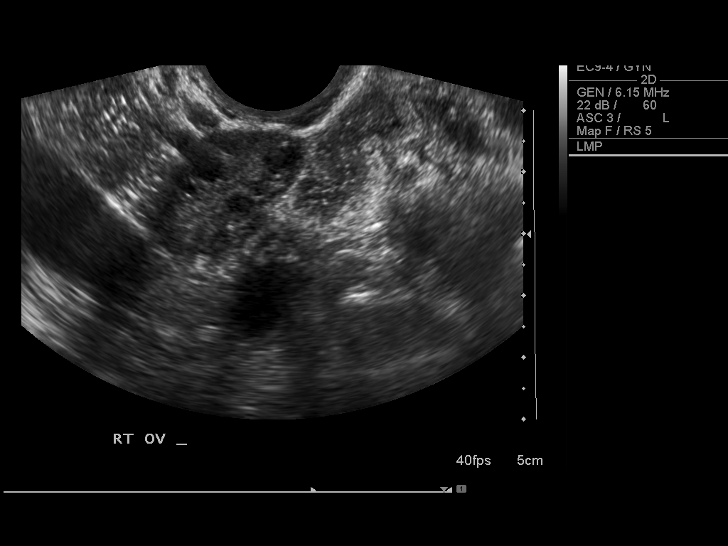
[im 42/46]
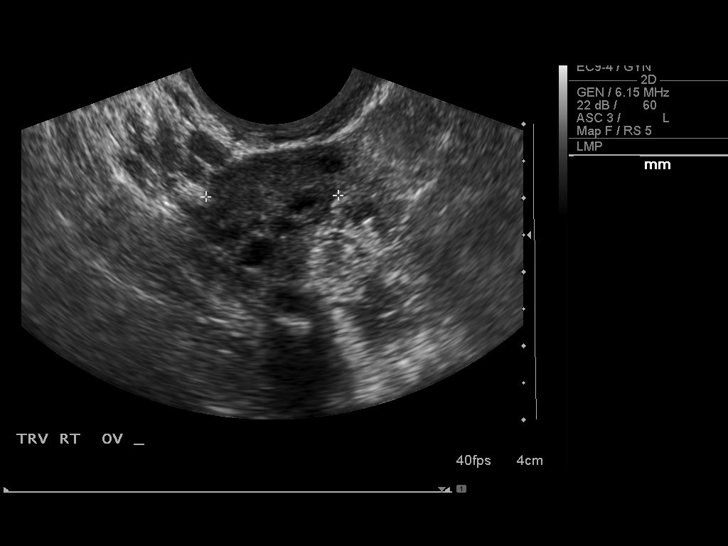
[im 46/46]
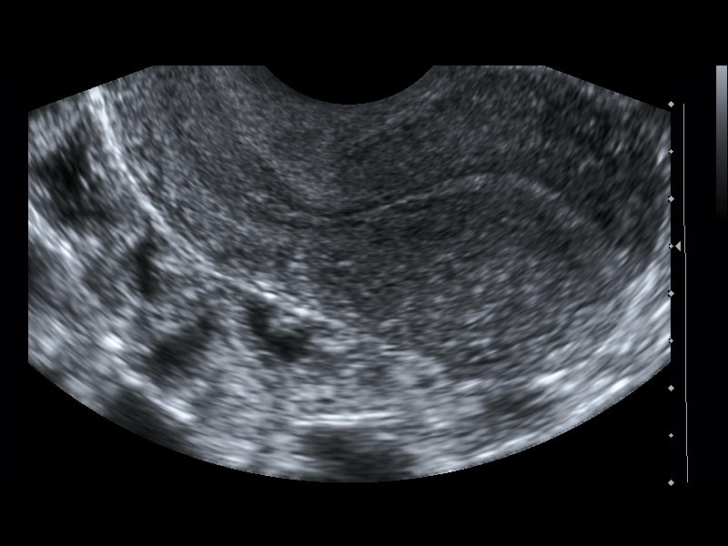

[14 of 25 positions shown; findings below may reference images not displayed]

FINDINGS: Uterus:  Normal in size and appearance, retroverted.

Endometrium: Normal in thickness and appearance.  Measures 4 mm.

Right ovary: Normal appearance/no adnexal mass.  Measures 3.2 x
x 1.6 cm.

Left ovary: Normal appearance/no adnexal mass.  Measures 3.1 x
x 1.8 cm.

Other Findings:  No free fluid
IMPRESSION: Normal study.  No evidence of pelvic mass or other significant
abnormality.

## 2015-10-05 ENCOUNTER — Other Ambulatory Visit (HOSPITAL_COMMUNITY)
Admission: RE | Admit: 2015-10-05 | Discharge: 2015-10-05 | Disposition: A | Discharge status: Home / Self Care | Payer: PRIVATE HEALTH INSURANCE | Source: Ambulatory Visit | Attending: Obstetrics and Gynecology | Admitting: Obstetrics and Gynecology

## 2015-10-05 ENCOUNTER — Other Ambulatory Visit: Payer: Self-pay | Admitting: Obstetrics and Gynecology

## 2015-10-05 DIAGNOSIS — Z113 Encounter for screening for infections with a predominantly sexual mode of transmission: Secondary | ICD-10-CM | POA: Insufficient documentation

## 2015-10-05 DIAGNOSIS — Z01419 Encounter for gynecological examination (general) (routine) without abnormal findings: Secondary | ICD-10-CM | POA: Insufficient documentation

## 2015-10-08 LAB — CYTOLOGY - PAP

## 2017-04-21 LAB — OB RESULTS CONSOLE GC/CHLAMYDIA
CHLAMYDIA, DNA PROBE: NEGATIVE
Gonorrhea: NEGATIVE

## 2017-04-21 LAB — OB RESULTS CONSOLE HIV ANTIBODY (ROUTINE TESTING): HIV: NONREACTIVE

## 2017-04-21 LAB — OB RESULTS CONSOLE RUBELLA ANTIBODY, IGM: Rubella: IMMUNE

## 2017-04-21 LAB — OB RESULTS CONSOLE HEPATITIS B SURFACE ANTIGEN: HEP B S AG: NEGATIVE

## 2017-04-21 LAB — OB RESULTS CONSOLE RPR: RPR: NONREACTIVE

## 2017-10-15 ENCOUNTER — Other Ambulatory Visit: Payer: Self-pay

## 2017-10-15 ENCOUNTER — Encounter (HOSPITAL_COMMUNITY): Payer: Self-pay

## 2017-10-15 ENCOUNTER — Inpatient Hospital Stay (HOSPITAL_COMMUNITY)
Admission: AD | Admit: 2017-10-15 | Discharge: 2017-10-20 | DRG: 805 | Disposition: A | Discharge status: Home / Self Care | Payer: 59 | Attending: Obstetrics and Gynecology | Admitting: Obstetrics and Gynecology

## 2017-10-15 DIAGNOSIS — O4290 Premature rupture of membranes, unspecified as to length of time between rupture and onset of labor, unspecified weeks of gestation: Secondary | ICD-10-CM | POA: Diagnosis not present

## 2017-10-15 DIAGNOSIS — O26613 Liver and biliary tract disorders in pregnancy, third trimester: Secondary | ICD-10-CM | POA: Diagnosis not present

## 2017-10-15 DIAGNOSIS — K831 Obstruction of bile duct: Secondary | ICD-10-CM | POA: Diagnosis present

## 2017-10-15 DIAGNOSIS — O42919 Preterm premature rupture of membranes, unspecified as to length of time between rupture and onset of labor, unspecified trimester: Secondary | ICD-10-CM | POA: Diagnosis present

## 2017-10-15 DIAGNOSIS — Z3A33 33 weeks gestation of pregnancy: Secondary | ICD-10-CM | POA: Diagnosis not present

## 2017-10-15 DIAGNOSIS — O2662 Liver and biliary tract disorders in childbirth: Secondary | ICD-10-CM | POA: Diagnosis present

## 2017-10-15 DIAGNOSIS — Z363 Encounter for antenatal screening for malformations: Secondary | ICD-10-CM

## 2017-10-15 DIAGNOSIS — O42913 Preterm premature rupture of membranes, unspecified as to length of time between rupture and onset of labor, third trimester: Principal | ICD-10-CM | POA: Diagnosis present

## 2017-10-15 DIAGNOSIS — Z3490 Encounter for supervision of normal pregnancy, unspecified, unspecified trimester: Secondary | ICD-10-CM

## 2017-10-15 LAB — CBC
HCT: 35.1 % — ABNORMAL LOW (ref 36.0–46.0)
HEMOGLOBIN: 12.3 g/dL (ref 12.0–15.0)
MCH: 31.5 pg (ref 26.0–34.0)
MCHC: 35 g/dL (ref 30.0–36.0)
MCV: 90 fL (ref 78.0–100.0)
Platelets: 282 10*3/uL (ref 150–400)
RBC: 3.9 MIL/uL (ref 3.87–5.11)
RDW: 13 % (ref 11.5–15.5)
WBC: 18.6 10*3/uL — ABNORMAL HIGH (ref 4.0–10.5)

## 2017-10-15 LAB — TYPE AND SCREEN
ABO/RH(D): A POS
Antibody Screen: NEGATIVE

## 2017-10-15 LAB — URINALYSIS, ROUTINE W REFLEX MICROSCOPIC
BILIRUBIN URINE: NEGATIVE
Glucose, UA: NEGATIVE mg/dL
Hgb urine dipstick: NEGATIVE
Ketones, ur: NEGATIVE mg/dL
Leukocytes, UA: NEGATIVE
Nitrite: NEGATIVE
PH: 6 (ref 5.0–8.0)
Protein, ur: NEGATIVE mg/dL
SPECIFIC GRAVITY, URINE: 1.021 (ref 1.005–1.030)

## 2017-10-15 MED ORDER — AMOXICILLIN 500 MG PO CAPS
500.0000 mg | ORAL_CAPSULE | Freq: Three times a day (TID) | ORAL | Status: DC
  Administered 2017-10-17: 500 mg via ORAL
  Filled 2017-10-15 (×3): qty 1

## 2017-10-15 MED ORDER — SODIUM CHLORIDE 0.9% FLUSH: 3.0000 mL | INTRAVENOUS | Status: DC | PRN

## 2017-10-15 MED ORDER — CALCIUM CARBONATE ANTACID 500 MG PO CHEW
2.0000 | CHEWABLE_TABLET | ORAL | Status: DC | PRN
  Administered 2017-10-16 – 2017-10-17 (×2): 400 mg via ORAL
  Filled 2017-10-15 (×2): qty 2

## 2017-10-15 MED ORDER — SODIUM CHLORIDE 0.9 % IV SOLN
250.0000 mL | INTRAVENOUS | Status: DC | PRN
Start: 2017-10-15 — End: 2017-10-16
  Administered 2017-10-15 – 2017-10-16 (×3): 250 mL via INTRAVENOUS

## 2017-10-15 MED ORDER — ZOLPIDEM TARTRATE 5 MG PO TABS: 5.0000 mg | ORAL_TABLET | Freq: Every evening | ORAL | Status: DC | PRN

## 2017-10-15 MED ORDER — SODIUM CHLORIDE 0.9% FLUSH: 3.0000 mL | Freq: Two times a day (BID) | INTRAVENOUS | Status: DC

## 2017-10-15 MED ORDER — DOCUSATE SODIUM 100 MG PO CAPS
100.0000 mg | ORAL_CAPSULE | Freq: Every day | ORAL | Status: DC
  Filled 2017-10-15: qty 1

## 2017-10-15 MED ORDER — AZITHROMYCIN 250 MG PO TABS
500.0000 mg | ORAL_TABLET | Freq: Every day | ORAL | Status: DC
  Administered 2017-10-15 – 2017-10-17 (×3): 500 mg via ORAL
  Filled 2017-10-15 (×2): qty 2
  Filled 2017-10-15: qty 1
  Filled 2017-10-15: qty 2
  Filled 2017-10-15: qty 1

## 2017-10-15 MED ORDER — PRENATAL MULTIVITAMIN CH
1.0000 | ORAL_TABLET | Freq: Every day | ORAL | Status: DC
  Administered 2017-10-16 – 2017-10-17 (×2): 1 via ORAL
  Filled 2017-10-15 (×2): qty 1

## 2017-10-15 MED ORDER — SODIUM CHLORIDE 0.9 % IV SOLN
2.0000 g | Freq: Four times a day (QID) | INTRAVENOUS | Status: AC
  Administered 2017-10-15 – 2017-10-17 (×8): 2 g via INTRAVENOUS
  Filled 2017-10-15 (×2): qty 2
  Filled 2017-10-15: qty 2000
  Filled 2017-10-15 (×2): qty 2
  Filled 2017-10-15: qty 2000
  Filled 2017-10-15 (×4): qty 2

## 2017-10-15 MED ORDER — BETAMETHASONE SOD PHOS & ACET 6 (3-3) MG/ML IJ SUSP
12.0000 mg | INTRAMUSCULAR | Status: AC
  Administered 2017-10-15 – 2017-10-16 (×2): 12 mg via INTRAMUSCULAR
  Filled 2017-10-15 (×2): qty 2

## 2017-10-15 MED ORDER — ACETAMINOPHEN 325 MG PO TABS
650.0000 mg | ORAL_TABLET | ORAL | Status: DC | PRN
  Administered 2017-10-17 (×3): 650 mg via ORAL
  Filled 2017-10-15 (×4): qty 2

## 2017-10-15 NOTE — H&P (Signed)
Dawn Perez is a 32 y.o. female presenting for PPROM, no signif ctx. OB History    Gravida  1   Para  0   Term  0   Preterm  0   AB  0   Living  0     SAB  0   TAB  0   Ectopic  0   Multiple  0   Live Births             Past Medical History:  Diagnosis Date  . Boil of buttock 08/06/2014  . Contraceptive management 04/08/2013  . Hyperlipidemia    Past Surgical History:  Procedure Laterality Date  . WISDOM TOOTH EXTRACTION     Family History: family history includes Autoimmune disease (age of onset: 330) in her father; COPD in her paternal grandfather; Cancer in her maternal aunt and other; Heart disease in her maternal grandfather; Hyperlipidemia in her mother; Hypertension in her mother; Stroke in her maternal grandfather and paternal grandmother. Social History:  reports that she has never smoked. She has never used smokeless tobacco. She reports that she drinks about 5.0 standard drinks of alcohol per week. She reports that she does not use drugs.     Maternal Diabetes: No Genetic Screening: Normal Maternal Ultrasounds/Referrals: Normal Fetal Ultrasounds or other Referrals:  None Maternal Substance Abuse:  No Significant Maternal Medications:  None Significant Maternal Lab Results:  None Other Comments:  None  ROS History   Blood pressure 136/82, pulse 90, temperature 98.3 F (36.8 C), temperature source Oral, resp. rate 18, height 5\' 6"  (1.676 m). Exam Physical Exam  Constitutional: She is oriented to person, place, and time. She appears well-developed and well-nourished.  HENT:  Head: Normocephalic and atraumatic.  Neck: Normal range of motion.  Cardiovascular: Normal rate and regular rhythm.  Respiratory: Effort normal and breath sounds normal.  GI:  32 CM FH, FHR 132  Genitourinary:  Genitourinary Comments: copious clear AF  Musculoskeletal: Normal range of motion.  Neurological: She is alert and oriented to person, place, and time.  Skin:  Skin is dry.    Prenatal labs: ABO, Rh:   Antibody:   Rubella:   RPR:    HBsAg:    HIV:    GBS:     Assessment/Plan: 4775w3d PPROM For adm with Iv/PO ABX and BMZ  NICU @ apacity, discussed poss tocolysis X 48 hrs or transfer if active, more adv labor Discussed w/ Dr Joya Salmcarlos   Dawn Perez Dawn Perez Dawn Perez 10/15/2017, 7:00 PM

## 2017-10-15 NOTE — Plan of Care (Signed)
  Problem: Education: Goal: Knowledge of disease or condition will improve Outcome: Progressing   

## 2017-10-15 NOTE — Progress Notes (Addendum)
G1 @ 33.[redacted] wksga. Presents to triage for LOF that's clear. While in class started leaking clear fluid around 1415 and continued to trickle small amount all day. Denies bleeding. +FM   EFM applied.   Current pregnancy complicated with cholestasis. Takes benadryl and zyrtec PRN for the itching.   BP 136/82   Pulse 90   Temp 98.3 F (36.8 C) (Oral)   Resp 18   Ht 5\' 6"  (1.676 m)   BMI 25.02 kg/m   1923: Labs drawn. Saline lock placed. 1926: GBS swab collected and sent to lab  1932: 3rd floor notified. Report given. Room assigned to 318   1936: Pt to 3rd floor via wheelchair. Charges completed

## 2017-10-15 NOTE — Progress Notes (Signed)
Pt appears stable, discussed care plan w/ ABX + BMZ, poss tocolysis if needed X 48 hrs

## 2017-10-15 NOTE — MAU Provider Note (Signed)
History     CSN: [id number]  Arrival date and time: 10/15/17 1710   First Provider Initiated Contact with Patient 10/15/17 1815      Chief Complaint  Patient presents with  . Nausea  . Rupture of Membranes   Dawn Perez is a 32 y.o. G1P0000 at [redacted]w[redacted]d who presents today with leaking of fluid. She states that around 1615 she started leaking clear fluid and she has been leaking since. She is now starting to feel some cramping/contractions. She denies any vaginal bleeding. She reports normal fetal movement. She does have cholestasis with this pregnancy.   Vaginal Discharge  The patient's primary symptoms include vaginal discharge. This is a new problem. The current episode started today. The problem occurs constantly. The problem has been unchanged. The pain is mild. She is pregnant. The vaginal discharge was watery and clear. There has been no bleeding. Nothing aggravates the symptoms. She has tried nothing for the symptoms.    OB History    Gravida  1   Para  0   Term  0   Preterm  0   AB  0   Living  0     SAB  0   TAB  0   Ectopic  0   Multiple  0   Live Births              Past Medical History:  Diagnosis Date  . Boil of buttock 08/06/2014  . Contraceptive management 04/08/2013  . Hyperlipidemia     Past Surgical History:  Procedure Laterality Date  . WISDOM TOOTH EXTRACTION      Family History  Problem Relation Age of Onset  . Hypertension Mother   . Hyperlipidemia Mother   . Autoimmune disease Father 30       Mixed connective tissue disorder  . Heart disease Maternal Grandfather   . Stroke Maternal Grandfather   . Cancer Other        breast x2 aunts  . Stroke Paternal Grandmother   . COPD Paternal Grandfather   . Cancer Maternal Aunt     Social History   Tobacco Use  . Smoking status: Never Smoker  . Smokeless tobacco: Never Used  Substance Use Topics  . Alcohol use: Yes    Alcohol/week: 5.0 standard drinks    Types: 2 Glasses of  wine, 2 Cans of beer, 1 Standard drinks or equivalent per week    Comment: 2 times a week  . Drug use: No    Allergies:  Allergies  Allergen Reactions  . Sulfa Antibiotics Hives    Medications Prior to Admission  Medication Sig Dispense Refill Last Dose  . fluconazole (DIFLUCAN) 150 MG tablet Take 1 tablet (150 mg total) by mouth once. 1 tablet 1   . metroNIDAZOLE (METROGEL) 0.75 % vaginal gel Use 1 applicator in vagina at hs x 5 nights then 1 applicator prn 70 g 1 Taking  . Norethindrone-Ethinyl Estradiol-Fe Biphas (LO LOESTRIN FE) 1 MG-10 MCG / 10 MCG tablet Take 1 tablet by mouth daily. 1 Package prn Taking  . nystatin-triamcinolone ointment (MYCOLOG) Apply 1 application topically 2 (two) times daily. 30 g 1 Taking  . sulfamethoxazole-trimethoprim (BACTRIM DS,SEPTRA DS) 800-160 MG per tablet Take 1 tablet by mouth 2 (two) times daily. 28 tablet 0     Review of Systems  Genitourinary: Positive for vaginal discharge.   Physical Exam   Blood pressure 136/82, pulse 90, temperature 98.3 F (36.8 C), temperature source Oral, resp. rate  18, height 5\' 6"  (1.676 m).  Physical Exam  Nursing note and vitals reviewed. Constitutional: She is oriented to person, place, and time. She appears well-developed and well-nourished. No distress.  HENT:  Head: Normocephalic.  Cardiovascular: Normal rate.  Respiratory: Effort normal.  GI: Soft.  Genitourinary:  Genitourinary Comments:  External: no lesion Vagina: + pooling of copious clear fluid  Cervix: difficult to visualize 2/2 fluid  Uterus: AGA   Neurological: She is alert and oriented to person, place, and time.  Skin: Skin is warm and dry.  Psychiatric: She has a normal mood and affect.   NST:  Baseline: 145 Variability: moderate Accels: 15x15 Decels: none Toco: none   FERN +   Pt informed that the ultrasound is considered a limited OB ultrasound and is not intended to be a complete ultrasound exam.  Patient also informed  that the ultrasound is not being completed with the intent of assessing for fetal or placental anomalies or any pelvic abnormalities.  Explained that the purpose of today's ultrasound is to assess for  presentation.  Patient acknowledges the purpose of the exam and the limitations of the study.    VERTEX   MAU Course  Procedures  MDM 6:39 PM DW Dr. Marcelle OverlieHolland re: patient's + SROM status. Number given to NICU attending to call about admission as NICU is high census right now .  Assessment and Plan  PPROM at 3779w3d Admit to 3rd floor for BMZ and latency abx    Thressa ShellerHeather Hogan 10/15/2017, 6:17 PM

## 2017-10-16 ENCOUNTER — Inpatient Hospital Stay (HOSPITAL_BASED_OUTPATIENT_CLINIC_OR_DEPARTMENT_OTHER): Payer: 59

## 2017-10-16 DIAGNOSIS — O26613 Liver and biliary tract disorders in pregnancy, third trimester: Secondary | ICD-10-CM

## 2017-10-16 DIAGNOSIS — Z363 Encounter for antenatal screening for malformations: Secondary | ICD-10-CM | POA: Diagnosis not present

## 2017-10-16 DIAGNOSIS — Z3A33 33 weeks gestation of pregnancy: Secondary | ICD-10-CM

## 2017-10-16 DIAGNOSIS — K831 Obstruction of bile duct: Secondary | ICD-10-CM

## 2017-10-16 DIAGNOSIS — O4290 Premature rupture of membranes, unspecified as to length of time between rupture and onset of labor, unspecified weeks of gestation: Secondary | ICD-10-CM | POA: Diagnosis not present

## 2017-10-16 LAB — ABO/RH: ABO/RH(D): A POS

## 2017-10-16 MED ORDER — MAGNESIUM SULFATE 40 G IN LACTATED RINGERS - SIMPLE
2.0000 g/h | INTRAVENOUS | Status: DC
  Administered 2017-10-16 – 2017-10-17 (×2): 2 g/h via INTRAVENOUS
  Filled 2017-10-16 (×2): qty 40

## 2017-10-16 MED ORDER — NIFEDIPINE 10 MG PO CAPS: 10.0000 mg | ORAL_CAPSULE | Freq: Four times a day (QID) | ORAL | Status: DC

## 2017-10-16 MED ORDER — MAGNESIUM SULFATE BOLUS VIA INFUSION
4.0000 g | Freq: Once | INTRAVENOUS | Status: AC
  Administered 2017-10-16: 4 g via INTRAVENOUS
  Filled 2017-10-16: qty 500

## 2017-10-16 MED ORDER — LACTATED RINGERS IV SOLN
INTRAVENOUS | Status: DC
  Administered 2017-10-17 (×2): via INTRAVENOUS

## 2017-10-16 MED ORDER — NIFEDIPINE 10 MG PO CAPS
10.0000 mg | ORAL_CAPSULE | Freq: Once | ORAL | Status: AC
  Administered 2017-10-16: 10 mg via ORAL
  Filled 2017-10-16: qty 1

## 2017-10-16 NOTE — Progress Notes (Signed)
Dr. Vincente PoliGrewal notified of increased contractions and variables. RN to give Procardia 10mg  once

## 2017-10-16 NOTE — Progress Notes (Signed)
Patient complains of increased cramping/contractions. RN unable to palpate uterus. Cxn not picking up on toco. Patient given button to press at start and finish of cxn.

## 2017-10-16 NOTE — Progress Notes (Signed)
Patient doing well. Some mild cramping.  Denies any vaginal bleeding.  BP 112/71 (BP Location: Right Arm)   Pulse 68   Temp 98 F (36.7 C) (Oral)   Resp 16   Ht 5\' 6"  (1.676 m)   Wt 95.7 kg   SpO2 97%   BMI 34.06 kg/m  Results for orders placed or performed during the hospital encounter of 10/15/17 (from the past 24 hour(s))  Urinalysis, Routine w reflex microscopic     Status: Abnormal   Collection Time: 10/15/17  5:28 PM  Result Value Ref Range   Color, Urine YELLOW YELLOW   APPearance HAZY (A) CLEAR   Specific Gravity, Urine 1.021 1.005 - 1.030   pH 6.0 5.0 - 8.0   Glucose, UA NEGATIVE NEGATIVE mg/dL   Hgb urine dipstick NEGATIVE NEGATIVE   Bilirubin Urine NEGATIVE NEGATIVE   Ketones, ur NEGATIVE NEGATIVE mg/dL   Protein, ur NEGATIVE NEGATIVE mg/dL   Nitrite NEGATIVE NEGATIVE   Leukocytes, UA NEGATIVE NEGATIVE  Type and screen [facility]     Status: None   Collection Time: 10/15/17  7:23 PM  Result Value Ref Range   ABO/RH(D) A POS    Antibody Screen NEG    Sample Expiration      10/18/2017 Performed at [facility], [address] [place], [place] [address]   CBC on admission     Status: Abnormal   Collection Time: 10/15/17  7:23 PM  Result Value Ref Range   WBC 18.6 (H) 4.0 - 10.5 K/uL   RBC 3.90 3.87 - 5.11 MIL/uL   Hemoglobin 12.3 12.0 - 15.0 g/dL   HCT 60.435.1 (L) 54.036.0 - 98.146.0 %   MCV 90.0 78.0 - 100.0 fL   MCH 31.5 26.0 - 34.0 pg   MCHC 35.0 30.0 - 36.0 g/dL   RDW 19.113.0 47.811.5 - 29.515.5 %   Platelets 282 150 - 400 K/uL  ABO/Rh     Status: None   Collection Time: 10/15/17  7:23 PM  Result Value Ref Range   ABO/RH(D)      A POS Performed at [facility], [address] [place], [place] [address]    Abdomen is soft and non tender FHR Category 1  IMPRESSION: IUP at 33 w 4 days PPROM 9 now for about 14 hours) Cholestasis of Pregnancy  PLAN: No evidence of chorioamnionitis Continue steroid series and antibiotics for  latency Patient doing very well Had extensive discussion about care plan with patient - all questions answered If clinically stable - consider 34 week induction of labor

## 2017-10-16 NOTE — Progress Notes (Signed)
Patient feeling more contractions  FHR Reactive with occasional variables  Cervix is 100% 2 cm 0 Vertex station   Discussed with patient To receive 2nd steroid shot tonight Start Magnesium for neuroprotection and tocolysis until 48 hours post steroids Possible delivery prior to 34 weeks

## 2017-10-16 NOTE — Progress Notes (Signed)
Pt states decreased FM, monitors applied at this time

## 2017-10-16 NOTE — Progress Notes (Signed)
Pt off fetal monitor transferred to rm 168 in bed with alll belongings and family

## 2017-10-17 LAB — CULTURE, BETA STREP (GROUP B ONLY)

## 2017-10-17 NOTE — Progress Notes (Signed)
Patient ID: Dawn DingwallBethany Perez, female   DOB: 02/11/1986, 32 y.o.   MRN: [id number] Pt without complaints GFM Clear fluid leaking  VSSAF FHR 140s with Accels Cat 1 Ctxs rare  Abd Gravid nt  PPROM at 4833 5/7 NICU aware and has space for baby when delivered (prev at capacity) S/P BMZ last HS.  Currently on Mag started last night for 48 h post BMZ and neuro protection DL

## 2017-10-17 NOTE — Plan of Care (Signed)
  Problem: Pain Managment: Goal: General experience of comfort will improve Outcome: Progressing   Problem: Safety: Goal: Ability to remain free from injury will improve Outcome: Progressing   Problem: Education: Goal: Knowledge of disease or condition will improve Outcome: Progressing   

## 2017-10-18 ENCOUNTER — Encounter (HOSPITAL_COMMUNITY): Payer: Self-pay | Admitting: Anesthesiology

## 2017-10-18 ENCOUNTER — Inpatient Hospital Stay (HOSPITAL_COMMUNITY): Payer: 59 | Admitting: Anesthesiology

## 2017-10-18 DIAGNOSIS — Z3490 Encounter for supervision of normal pregnancy, unspecified, unspecified trimester: Secondary | ICD-10-CM

## 2017-10-18 LAB — CBC
HEMATOCRIT: 31 % — AB (ref 36.0–46.0)
HEMOGLOBIN: 10.7 g/dL — AB (ref 12.0–15.0)
MCH: 31.5 pg (ref 26.0–34.0)
MCHC: 34.5 g/dL (ref 30.0–36.0)
MCV: 91.2 fL (ref 78.0–100.0)
Platelets: 255 10*3/uL (ref 150–400)
RBC: 3.4 MIL/uL — ABNORMAL LOW (ref 3.87–5.11)
RDW: 13.4 % (ref 11.5–15.5)
WBC: 17.3 10*3/uL — AB (ref 4.0–10.5)

## 2017-10-18 LAB — RPR: RPR Ser Ql: NONREACTIVE

## 2017-10-18 LAB — TYPE AND SCREEN
ABO/RH(D): A POS
ANTIBODY SCREEN: NEGATIVE

## 2017-10-18 MED ORDER — OXYCODONE-ACETAMINOPHEN 5-325 MG PO TABS: 2.0000 | ORAL_TABLET | ORAL | Status: DC | PRN

## 2017-10-18 MED ORDER — DIBUCAINE 1 % RE OINT: 1.0000 "application " | TOPICAL_OINTMENT | RECTAL | Status: DC | PRN

## 2017-10-18 MED ORDER — ZOLPIDEM TARTRATE 5 MG PO TABS: 5.0000 mg | ORAL_TABLET | Freq: Every evening | ORAL | Status: DC | PRN

## 2017-10-18 MED ORDER — OXYCODONE HCL 5 MG PO TABS: 10.0000 mg | ORAL_TABLET | ORAL | Status: DC | PRN

## 2017-10-18 MED ORDER — LACTATED RINGERS IV SOLN
500.0000 mL | Freq: Once | INTRAVENOUS | Status: AC
  Administered 2017-10-18: 500 mL via INTRAVENOUS

## 2017-10-18 MED ORDER — IBUPROFEN 600 MG PO TABS
600.0000 mg | ORAL_TABLET | Freq: Four times a day (QID) | ORAL | Status: DC
  Administered 2017-10-18 – 2017-10-20 (×7): 600 mg via ORAL
  Filled 2017-10-18 (×8): qty 1

## 2017-10-18 MED ORDER — FENTANYL 2.5 MCG/ML BUPIVACAINE 1/10 % EPIDURAL INFUSION (WH - ANES)
14.0000 mL/h | INTRAMUSCULAR | Status: DC | PRN
  Administered 2017-10-18: 14 mL/h via EPIDURAL
  Filled 2017-10-18: qty 100

## 2017-10-18 MED ORDER — OXYTOCIN BOLUS FROM INFUSION: 500.0000 mL | Freq: Once | INTRAVENOUS | Status: DC

## 2017-10-18 MED ORDER — SIMETHICONE 80 MG PO CHEW: 80.0000 mg | CHEWABLE_TABLET | ORAL | Status: DC | PRN

## 2017-10-18 MED ORDER — WITCH HAZEL-GLYCERIN EX PADS: 1.0000 "application " | MEDICATED_PAD | CUTANEOUS | Status: DC | PRN

## 2017-10-18 MED ORDER — OXYTOCIN 40 UNITS IN LACTATED RINGERS INFUSION - SIMPLE MED
1.0000 m[IU]/min | INTRAVENOUS | Status: DC
  Administered 2017-10-18: 2 m[IU]/min via INTRAVENOUS
  Filled 2017-10-18: qty 1000

## 2017-10-18 MED ORDER — SENNOSIDES-DOCUSATE SODIUM 8.6-50 MG PO TABS
2.0000 | ORAL_TABLET | ORAL | Status: DC
  Administered 2017-10-19: 2 via ORAL
  Filled 2017-10-18 (×2): qty 2

## 2017-10-18 MED ORDER — ACETAMINOPHEN 325 MG PO TABS: 650.0000 mg | ORAL_TABLET | ORAL | Status: DC | PRN

## 2017-10-18 MED ORDER — PENICILLIN G 3 MILLION UNITS IVPB - SIMPLE MED
3.0000 10*6.[IU] | INTRAVENOUS | Status: DC
  Administered 2017-10-18: 3 10*6.[IU] via INTRAVENOUS
  Filled 2017-10-18 (×4): qty 100
  Filled 2017-10-18: qty 3

## 2017-10-18 MED ORDER — LACTATED RINGERS IV SOLN
INTRAVENOUS | Status: DC
  Administered 2017-10-18: 08:00:00 via INTRAVENOUS

## 2017-10-18 MED ORDER — LIDOCAINE HCL (PF) 1 % IJ SOLN
INTRAMUSCULAR | Status: DC | PRN
  Administered 2017-10-18: 5 mL via EPIDURAL
  Administered 2017-10-18: 7 mL via EPIDURAL

## 2017-10-18 MED ORDER — LACTATED RINGERS IV SOLN: 500.0000 mL | INTRAVENOUS | Status: DC | PRN

## 2017-10-18 MED ORDER — OXYCODONE HCL 5 MG PO TABS
5.0000 mg | ORAL_TABLET | ORAL | Status: DC | PRN
  Administered 2017-10-20: 5 mg via ORAL
  Filled 2017-10-18: qty 1

## 2017-10-18 MED ORDER — BENZOCAINE-MENTHOL 20-0.5 % EX AERO
1.0000 "application " | INHALATION_SPRAY | CUTANEOUS | Status: DC | PRN
  Administered 2017-10-18: 1 via TOPICAL
  Filled 2017-10-18: qty 56

## 2017-10-18 MED ORDER — SOD CITRATE-CITRIC ACID 500-334 MG/5ML PO SOLN: 30.0000 mL | ORAL | Status: DC | PRN

## 2017-10-18 MED ORDER — ONDANSETRON HCL 4 MG PO TABS: 4.0000 mg | ORAL_TABLET | ORAL | Status: DC | PRN

## 2017-10-18 MED ORDER — PRENATAL MULTIVITAMIN CH
1.0000 | ORAL_TABLET | Freq: Every day | ORAL | Status: DC
  Administered 2017-10-19: 1 via ORAL
  Filled 2017-10-18 (×2): qty 1

## 2017-10-18 MED ORDER — TETANUS-DIPHTH-ACELL PERTUSSIS 5-2.5-18.5 LF-MCG/0.5 IM SUSP: 0.5000 mL | Freq: Once | INTRAMUSCULAR | Status: DC

## 2017-10-18 MED ORDER — LIDOCAINE HCL (PF) 1 % IJ SOLN
30.0000 mL | INTRAMUSCULAR | Status: DC | PRN
  Administered 2017-10-18: 30 mL via SUBCUTANEOUS
  Filled 2017-10-18: qty 30

## 2017-10-18 MED ORDER — OXYTOCIN 40 UNITS IN LACTATED RINGERS INFUSION - SIMPLE MED: 2.5000 [IU]/h | INTRAVENOUS | Status: DC

## 2017-10-18 MED ORDER — PHENYLEPHRINE 40 MCG/ML (10ML) SYRINGE FOR IV PUSH (FOR BLOOD PRESSURE SUPPORT)
80.0000 ug | PREFILLED_SYRINGE | INTRAVENOUS | Status: DC | PRN
  Filled 2017-10-18: qty 5

## 2017-10-18 MED ORDER — EPHEDRINE 5 MG/ML INJ
10.0000 mg | INTRAVENOUS | Status: DC | PRN
  Filled 2017-10-18: qty 2

## 2017-10-18 MED ORDER — DIPHENHYDRAMINE HCL 50 MG/ML IJ SOLN: 12.5000 mg | INTRAMUSCULAR | Status: DC | PRN

## 2017-10-18 MED ORDER — ONDANSETRON HCL 4 MG/2ML IJ SOLN: 4.0000 mg | Freq: Four times a day (QID) | INTRAMUSCULAR | Status: DC | PRN

## 2017-10-18 MED ORDER — ONDANSETRON HCL 4 MG/2ML IJ SOLN: 4.0000 mg | INTRAMUSCULAR | Status: DC | PRN

## 2017-10-18 MED ORDER — PHENYLEPHRINE 40 MCG/ML (10ML) SYRINGE FOR IV PUSH (FOR BLOOD PRESSURE SUPPORT)
80.0000 ug | PREFILLED_SYRINGE | INTRAVENOUS | Status: DC | PRN
  Filled 2017-10-18: qty 5
  Filled 2017-10-18: qty 10

## 2017-10-18 MED ORDER — SODIUM CHLORIDE 0.9 % IV SOLN
2.0000 g | Freq: Once | INTRAVENOUS | Status: AC
  Administered 2017-10-18: 2 g via INTRAVENOUS
  Filled 2017-10-18: qty 2

## 2017-10-18 MED ORDER — TERBUTALINE SULFATE 1 MG/ML IJ SOLN: 0.2500 mg | Freq: Once | INTRAMUSCULAR | Status: DC | PRN

## 2017-10-18 MED ORDER — DIPHENHYDRAMINE HCL 25 MG PO CAPS: 25.0000 mg | ORAL_CAPSULE | Freq: Four times a day (QID) | ORAL | Status: DC | PRN

## 2017-10-18 MED ORDER — OXYCODONE-ACETAMINOPHEN 5-325 MG PO TABS: 1.0000 | ORAL_TABLET | ORAL | Status: DC | PRN

## 2017-10-18 MED ORDER — COCONUT OIL OIL
1.0000 "application " | TOPICAL_OIL | Status: DC | PRN
  Administered 2017-10-18: 1 via TOPICAL
  Filled 2017-10-18: qty 120

## 2017-10-18 NOTE — Anesthesia Procedure Notes (Signed)
Epidural Patient location during procedure: OB Start time: 10/18/2017 4:42 AM End time: 10/18/2017 4:47 AM  Staffing Anesthesiologist: Leilani AbleHatchett, Violett Hobbs, MD Performed: anesthesiologist   Preanesthetic Checklist Completed: patient identified, site marked, surgical consent, pre-op evaluation, timeout performed, IV checked, risks and benefits discussed and monitors and equipment checked  Epidural Patient position: sitting Prep: site prepped and draped and DuraPrep Patient monitoring: continuous pulse ox and blood pressure Approach: midline Location: L3-L4 Injection technique: LOR air  Needle:  Needle type: Tuohy  Needle gauge: 17 G Needle length: 9 cm and 9 Needle insertion depth: 6 cm Catheter type: closed end flexible Catheter size: 19 Gauge Catheter at skin depth: 10 cm Test dose: negative and Other  Assessment Sensory level: T9 Events: blood not aspirated, injection not painful, no injection resistance, negative IV test and no paresthesia  Additional Notes Reason for block:procedure for pain

## 2017-10-18 NOTE — Progress Notes (Signed)
HA she had on magnesium sulfate resolving FHT cat one UCs irregular Epidural in Cx 7/C/-2/vtx A/P: PPROM          D/W patient > will start pitocin augmentation         S/P Ampicillin upon arrival to L&D         Will continue PCN for GBBS prophylaxis

## 2017-10-18 NOTE — Anesthesia Preprocedure Evaluation (Signed)
Anesthesia Evaluation  Patient identified by MRN, date of birth, ID band Patient awake    Reviewed: Allergy & Precautions, H&P , NPO status , Patient's Chart, lab work & pertinent test results  Airway Mallampati: II  TM Distance: >3 FB Neck ROM: full    Dental no notable dental hx. (+) Teeth Intact   Pulmonary neg pulmonary ROS,    Pulmonary exam normal breath sounds clear to auscultation       Cardiovascular negative cardio ROS   Rhythm:regular Rate:Normal     Neuro/Psych negative neurological ROS  negative psych ROS   GI/Hepatic negative GI ROS, Neg liver ROS,   Endo/Other  negative endocrine ROS  Renal/GU negative Renal ROS  negative genitourinary   Musculoskeletal negative musculoskeletal ROS (+)   Abdominal Normal abdominal exam  (+) + obese,   Peds  Hematology  (+) anemia ,   Anesthesia Other Findings   Reproductive/Obstetrics (+) Pregnancy                             Anesthesia Physical Anesthesia Plan  ASA: II  Anesthesia Plan: Epidural   Post-op Pain Management:    Induction:   PONV Risk Score and Plan:   Airway Management Planned:   Additional Equipment:   Intra-op Plan:   Post-operative Plan:   Informed Consent: I have reviewed the patients History and Physical, chart, labs and discussed the procedure including the risks, benefits and alternatives for the proposed anesthesia with the patient or authorized representative who has indicated his/her understanding and acceptance.     Plan Discussed with:   Anesthesia Plan Comments:         Anesthesia Quick Evaluation

## 2017-10-18 NOTE — Anesthesia Pain Management Evaluation Note (Signed)
  CRNA Pain Management Visit Note  Patient: Dawn Perez, 32 y.o., female  "Hello I am a member of the anesthesia team at [facility]. We have an anesthesia team available at all times to provide care throughout the hospital, including epidural management and anesthesia for C-section. I don't know your plan for the delivery whether it a natural birth, water birth, IV sedation, nitrous supplementation, doula or epidural, but we want to meet your pain goals."   1.Was your pain managed to your expectations on prior hospitalizations?   No prior hospitalizations  2.What is your expectation for pain management during this hospitalization?     Epidural  3.How can we help you reach that goal? Epidural intact  Record the patient's initial score and the patient's pain goal.   Pain: 0  Pain Goal: 7 [facility] wants you to be able to say your pain was always managed very well.  Edison PaceWILKERSON,Ozetta Flatley 10/18/2017

## 2017-10-18 NOTE — Progress Notes (Signed)
Delivery Note At 11:28 AM a viable female was delivered via Vaginal, Spontaneous (Presentation:LOA ;  ).  APGAR: , ; weight  .   Placenta status:intact ,to pathology .  Cord: 3 vessels with the following complications:loose nuchal cord x 1 .  Cord pH: pending  Anesthesia:  Epidural, 1% lidocaine local Episiotomy: None Lacerations:  Second degree right periurethral>repaired Suture Repair: 2.0 vicryl rapide Est. Blood Loss (mL):  250  Mom to postpartum.  Baby to Couplet care / Skin to Skin.  Dawn LocusJames E Tiki Tucciarone II 10/18/2017, 11:39 AM

## 2017-10-18 NOTE — Anesthesia Postprocedure Evaluation (Signed)
Anesthesia Post Note  Patient: Dawn Perez  Procedure(s) Performed: AN AD HOC LABOR EPIDURAL     Patient location during evaluation: [facility] Anesthesia Type: Epidural Level of consciousness: awake and alert Pain management: pain level controlled Vital Signs Assessment: post-procedure vital signs reviewed and stable Respiratory status: spontaneous breathing, nonlabored ventilation and respiratory function stable Cardiovascular status: stable Postop Assessment: no headache, no backache and epidural receding Anesthetic complications: no    Last Vitals:  Vitals:   10/18/17 1413 10/18/17 1628  BP: 108/64 128/68  Pulse: 60 68  Resp: 18 18  Temp: 36.9 C 36.8 C  SpO2:  99%    Last Pain:  Vitals:   10/18/17 1628  TempSrc: Oral  PainSc:    Pain Goal: Patients Stated Pain Goal: 3 (10/18/17 1415)               Marrion CoyMERRITT,Twilia Yaklin

## 2017-10-19 LAB — CBC
HEMATOCRIT: 31.1 % — AB (ref 36.0–46.0)
Hemoglobin: 10.9 g/dL — ABNORMAL LOW (ref 12.0–15.0)
MCH: 32.2 pg (ref 26.0–34.0)
MCHC: 35 g/dL (ref 30.0–36.0)
MCV: 92 fL (ref 78.0–100.0)
Platelets: 230 10*3/uL (ref 150–400)
RBC: 3.38 MIL/uL — ABNORMAL LOW (ref 3.87–5.11)
RDW: 13.4 % (ref 11.5–15.5)
WBC: 15.1 10*3/uL — ABNORMAL HIGH (ref 4.0–10.5)

## 2017-10-19 NOTE — Progress Notes (Signed)
Post Partum Day 1 Subjective: no complaints  Objective: Blood pressure 133/78, pulse 71, temperature 98.8 F (37.1 C), temperature source Oral, resp. rate 18, height 5\' 6"  (1.676 m), weight 95.7 kg, SpO2 99 %, unknown if currently breastfeeding.  Physical Exam:  General: alert Lochia: appropriate Uterine Fundus: firm Incision: healing well DVT Evaluation: No evidence of DVT seen on physical exam.  Recent Labs    10/18/17 0319 10/19/17 0547  HGB 10.7* 10.9*  HCT 31.0* 31.1*    Assessment/Plan: Plan for discharge tomorrow  Baby stable in NICU   LOS: 4 days   Meriel PicaRichard M Delainy Mcelhiney 10/19/2017, 8:52 AM

## 2017-10-19 NOTE — Lactation Note (Signed)
This note was copied from a baby's chart. Lactation Consultation Note; Initial visit with this NICU mom. Baby born at 33.6 Dawn Perez.   Mom reports she has pumped 4 times and obtained a few drops at two of those pumpings. Reports she has been taught hand expression but is unable to obtain any Colostrum. Encouragement given. Encouraged to pump at least 8 times/day Reviewed importance of frequent pumping to promote a good milk supply.   Has insurance and plans to call them about a pump for home today. Discussed pump rental if unable to get pump before DC home.   No questions at present. Has NICU booklet. BF brochure given. Reviewed our phone number to call after DC.   Patient Name: Boy Dawn Perez's Date: 10/19/2017 Reason for consult: Initial assessment;Preterm <34wks;Primapara;NICU baby   Maternal Data Formula Feeding for Exclusion: No Has patient been taught Hand Expression?: Yes Does the patient have breastfeeding experience prior to this delivery?: No  Feeding Feeding Type: Donor Breast Milk  LATCH Score                   Interventions Interventions: Coconut oil;Hand express  Lactation Tools Discussed/Used WIC Program: No   Consult Status Consult Status: Follow-up Date: 10/20/17 Follow-up type: In-patient    Dawn Perez, Dawn Perez D 10/19/2017, 8:12 AM

## 2017-10-19 NOTE — Lactation Note (Signed)
This note was copied from a baby's chart. Lactation Consultation Note  Patient Name: Dawn Faythe DingwallBethany Melucci Today's Date: 10/19/2017   LC entered room to set up DEBP ,  patient sleep. Left pump set -up at nurses station for when patient  is awake.   Maternal Data    Feeding Feeding Type: Donor Breast Milk  LATCH Score                   Interventions    Lactation Tools Discussed/Used     Consult Status      Danelle EarthlyRobin Chevonne Bostrom 10/19/2017, 2:57 AM

## 2017-10-20 NOTE — Progress Notes (Signed)
Discharge teaching complete with pt. Pt understood all information and did not have any questions. 

## 2017-10-20 NOTE — Discharge Summary (Signed)
Obstetric Discharge Summary Reason for Admission: rupture of membranes and PPROM Prenatal Procedures: none Intrapartum Procedures: spontaneous vaginal delivery Postpartum Procedures: none Complications-Operative and Postpartum: 2nd degree perineal laceration Hemoglobin  Date Value Ref Range Status  10/19/2017 10.9 (L) 12.0 - 15.0 g/dL Final   HCT  Date Value Ref Range Status  10/19/2017 31.1 (L) 36.0 - 46.0 % Final    Physical Exam:  General: alert, cooperative and appears stated age 20Lochia: appropriate Uterine Fundus: firm Incision: healing well, no significant drainage, no dehiscence, no significant erythema DVT Evaluation: No evidence of DVT seen on physical exam. Negative Homan's sign. No cords or calf tenderness. No significant calf/ankle edema.  Discharge Diagnoses: PPROM s/p NSVD  Discharge Information: Date: 10/20/2017 Activity: pelvic rest Diet: routine Medications: None Condition: stable Instructions: refer to practice specific booklet Discharge to: home   Newborn Data: Live born female  Birth Weight: 4 lb 5.1 oz (1960 g) APGAR: 8, 9  Newborn Delivery   Birth date/time:  10/18/2017 11:28:00 Delivery type:  Vaginal, Spontaneous     Home with NICU.  Ranae Pilalise Jennifer Kevon Tench 10/20/2017, 11:16 AM

## 2017-10-20 NOTE — Lactation Note (Signed)
This note was copied from a baby's chart. Lactation Consultation Note  Patient Name: Boy Brande Uncapher HGRJW'B Date: 10/20/2017 Reason for consult: Follow-up assessment;NICU baby;Preterm <34wks  Baby is 64 hours old, per mom baby is progressing well.  Per mom :  Mom has pumped x 8 in the last 24 hours with EBM yield.  And has been hand expressing after pumping.  LC recommended when visiting the baby plan on pumping in NICU and taking  Her DEBP kit with her. The DEBP is coming in the mail today- Spectra 2 .  LC reminded mom of the different size flanges and if the smaller one is comfortable to use it, otherwise bump up to the larger one. Hand express before and after to enhance volume and let down . Mom using coconut oil prior to pumping. Sore nipple and engorgement prevention and tx reviewed.  [place] praised mom for her efforts pumping and reassured her the volume will be gradually increase.  LC recommended STS with the baby, also to nuzzle, and allow the baby to be near breast/ may latch. Due to [place] won't be the baby's primary source of nutrition for now. Mom understood.  Mom seemed to be excited the baby was going to be fed today.   LC reminded mom she can call  LC with breast feeding questions or concerns. Also when she is in NICU can have RN call [place].    Maternal Data Has patient been taught Hand Expression?: Yes(per mom has been hand expressing after pumping ) Does the patient have breastfeeding experience prior to this delivery?: No  Feeding Feeding Type: Donor Breast Milk  LATCH Score                   Interventions Interventions: Breast feeding basics reviewed;Coconut oil;Hand express  Lactation Tools Discussed/Used Tools: Pump(per mom has pumped x 8 in the last 24 hours , with EBM yield - milk is gradually coming in ) Breast pump type: Double-Electric Breast Pump WIC Program: No Pump Review: Milk Storage(mom aware of milk storage guidelines )   Consult  Status Consult Status: PRN Date: (baby is in NICU ) Follow-up type: Call as needed    Myer Haff 10/20/2017, 7:58 AM

## 2017-10-21 ENCOUNTER — Inpatient Hospital Stay (HOSPITAL_COMMUNITY)
Admission: AD | Admit: 2017-10-21 | Discharge: 2017-10-21 | Disposition: A | Discharge status: Home / Self Care | Payer: 59 | Source: Ambulatory Visit | Attending: Obstetrics and Gynecology | Admitting: Obstetrics and Gynecology

## 2017-10-21 ENCOUNTER — Encounter (HOSPITAL_COMMUNITY): Payer: Self-pay | Admitting: *Deleted

## 2017-10-21 ENCOUNTER — Inpatient Hospital Stay (HOSPITAL_COMMUNITY): Payer: 59

## 2017-10-21 DIAGNOSIS — O9089 Other complications of the puerperium, not elsewhere classified: Secondary | ICD-10-CM | POA: Diagnosis not present

## 2017-10-21 DIAGNOSIS — R109 Unspecified abdominal pain: Secondary | ICD-10-CM | POA: Diagnosis present

## 2017-10-21 DIAGNOSIS — R1011 Right upper quadrant pain: Secondary | ICD-10-CM | POA: Diagnosis not present

## 2017-10-21 DIAGNOSIS — R51 Headache: Secondary | ICD-10-CM

## 2017-10-21 DIAGNOSIS — R112 Nausea with vomiting, unspecified: Secondary | ICD-10-CM | POA: Diagnosis not present

## 2017-10-21 LAB — URINALYSIS, ROUTINE W REFLEX MICROSCOPIC
BILIRUBIN URINE: NEGATIVE
Glucose, UA: NEGATIVE mg/dL
HGB URINE DIPSTICK: NEGATIVE
Ketones, ur: NEGATIVE mg/dL
Leukocytes, UA: NEGATIVE
Nitrite: NEGATIVE
PROTEIN: NEGATIVE mg/dL
Specific Gravity, Urine: 1.015 (ref 1.005–1.030)
pH: 6 (ref 5.0–8.0)

## 2017-10-21 LAB — COMPREHENSIVE METABOLIC PANEL
ALK PHOS: 153 U/L — AB (ref 38–126)
ALT: 38 U/L (ref 0–44)
ANION GAP: 12 (ref 5–15)
AST: 37 U/L (ref 15–41)
Albumin: 3.2 g/dL — ABNORMAL LOW (ref 3.5–5.0)
BUN: 15 mg/dL (ref 6–20)
CALCIUM: 9.8 mg/dL (ref 8.9–10.3)
CHLORIDE: 99 mmol/L (ref 98–111)
CO2: 23 mmol/L (ref 22–32)
Creatinine, Ser: 0.76 mg/dL (ref 0.44–1.00)
GFR calc non Af Amer: >60 mL/min (ref >60)
Glucose, Bld: 88 mg/dL (ref 70–99)
Potassium: 4.1 mmol/L (ref 3.5–5.1)
SODIUM: 134 mmol/L — AB (ref 135–145)
Total Bilirubin: 0.3 mg/dL (ref 0.3–1.2)
Total Protein: 6.6 g/dL (ref 6.5–8.1)

## 2017-10-21 LAB — CBC
HCT: 37.1 % (ref 36.0–46.0)
Hemoglobin: 12.5 g/dL (ref 12.0–15.0)
MCH: 30.9 pg (ref 26.0–34.0)
MCHC: 33.7 g/dL (ref 30.0–36.0)
MCV: 91.8 fL (ref 78.0–100.0)
PLATELETS: 330 10*3/uL (ref 150–400)
RBC: 4.04 MIL/uL (ref 3.87–5.11)
RDW: 13.4 % (ref 11.5–15.5)
WBC: 20.4 10*3/uL — AB (ref 4.0–10.5)

## 2017-10-21 LAB — PROTEIN / CREATININE RATIO, URINE
Creatinine, Urine: 78 mg/dL
PROTEIN CREATININE RATIO: 0.09 mg/mg{creat} (ref 0.00–0.15)
Total Protein, Urine: 7 mg/dL

## 2017-10-21 LAB — AMYLASE: Amylase: 92 U/L (ref 28–100)

## 2017-10-21 LAB — LIPASE, BLOOD: LIPASE: 50 U/L (ref 11–51)

## 2017-10-21 MED ORDER — ONDANSETRON HCL 4 MG/2ML IJ SOLN
4.0000 mg | Freq: Once | INTRAMUSCULAR | Status: AC
  Administered 2017-10-21: 4 mg via INTRAVENOUS
  Filled 2017-10-21: qty 2

## 2017-10-21 MED ORDER — BUTALBITAL-APAP-CAFFEINE 50-325-40 MG PO TABS
2.0000 | ORAL_TABLET | Freq: Once | ORAL | Status: AC
  Administered 2017-10-21: 2 via ORAL
  Filled 2017-10-21: qty 2

## 2017-10-21 NOTE — Discharge Instructions (Signed)
Low-Fat Diet for Pancreatitis or Gallbladder Conditions A low-fat diet can be helpful if you have pancreatitis or a gallbladder condition. With these conditions, your pancreas and gallbladder have trouble digesting fats. A healthy eating plan with less fat will help rest your pancreas and gallbladder and reduce your symptoms. What do I need to know about this diet?  Eat a low-fat diet. ? Reduce your fat intake to less than 20-30% of your total daily calories. This is less than 50-60 g of fat per day. ? Remember that you need some fat in your diet. Ask your dietician what your daily goal should be. ? Choose nonfat and low-fat healthy foods. Look for the words "nonfat," "low fat," or "fat free." ? As a guide, look on the label and choose foods with less than 3 g of fat per serving. Eat only one serving.  Avoid alcohol.  Do not smoke. If you need help quitting, talk with your health care provider.  Eat small frequent meals instead of three large heavy meals. What foods can I eat? Grains Include healthy grains and starches such as potatoes, wheat bread, fiber-rich cereal, and brown rice. Choose whole grain options whenever possible. In adults, whole grains should account for 45-65% of your daily calories. Fruits and Vegetables Eat plenty of fruits and vegetables. Fresh fruits and vegetables add fiber to your diet. Meats and Other Protein Sources Eat lean meat such as chicken and pork. Trim any fat off of meat before cooking it. Eggs, fish, and beans are other sources of protein. In adults, these foods should account for 10-35% of your daily calories. Dairy Choose low-fat milk and dairy options. Dairy includes fat and protein, as well as calcium. Fats and Oils Limit high-fat foods such as fried foods, sweets, baked goods, sugary drinks. Other Creamy sauces and condiments, such as mayonnaise, can add extra fat. Think about whether or not you need to use them, or use smaller amounts or low fat  options. What foods are not recommended?  High fat foods, such as: ? Baked goods. ? Ice cream. ? French toast. ? Sweet rolls. ? Pizza. ? Cheese bread. ? Foods covered with batter, butter, creamy sauces, or cheese. ? Fried foods. ? Sugary drinks and desserts.  Foods that cause gas or bloating This information is not intended to replace advice given to you by your health care provider. Make sure you discuss any questions you have with your health care provider. Document Released: 02/26/2013 Document Revised: 07/30/2015 Document Reviewed: 02/04/2013 Elsevier Interactive Patient Education  2017 Elsevier Inc.  

## 2017-10-21 NOTE — MAU Note (Signed)
Pt is PP delivered preterm infant on Wed.went home Friday. Was up in NICU today and started having mid to upper abd pain and then headache. Started to go home and on the drive home vomited. Abd pain and headache are getting worse.

## 2017-10-21 NOTE — MAU Provider Note (Addendum)
History     CSN: [id number]  Arrival date and time: 10/21/17 1801   First Provider Initiated Contact with Patient 10/21/17 1832     Chief Complaint  Patient presents with  . Abdominal Pain  . Emesis  . Headache   HPI Dawn Perez is a 32 y.o. G1P0101 postpartum patient who presents with headache, abdominal pain, nausea and vomiting. She states she was discharged from the hospital yesterday after a vaginal delivery on 8/14 of a 32 week infant due to PPROM. She denies any complications with delivery, received mag for neuro protection. Denies any high blood pressure in the pregnancy. Reports cholestasis with minimal symptoms.  She states after delivery, she had sharp shooting upper abdominal pains that are progressively getting worse. She states after leaving NICU today, the pain in her abdomen was worse, causing her to have to pull over and vomit. She also reports a throbbing headache that started after she vomited. She denies any visual changes. Denies any pain elsewhere and reports normal lochia.   She is pumping and feels like her milk has come in. She reports feeling like she has a fever but has not checked her temperature. She states she has been unable to fully empty her breasts since her milk has come in.   OB History    Gravida  1   Para  1   Term  0   Preterm  1   AB  0   Living  1     SAB  0   TAB  0   Ectopic  0   Multiple  0   Live Births  1           Past Medical History:  Diagnosis Date  . Boil of buttock 08/06/2014  . Contraceptive management 04/08/2013  . Hyperlipidemia     Past Surgical History:  Procedure Laterality Date  . WISDOM TOOTH EXTRACTION      Family History  Problem Relation Age of Onset  . Hypertension Mother   . Hyperlipidemia Mother   . Autoimmune disease Father 30       Mixed connective tissue disorder  . Heart disease Maternal Grandfather   . Stroke Maternal Grandfather   . Cancer Other        breast x2 aunts  .  Stroke Paternal Grandmother   . COPD Paternal Grandfather   . Cancer Maternal Aunt     Social History   Tobacco Use  . Smoking status: Never Smoker  . Smokeless tobacco: Never Used  Substance Use Topics  . Alcohol use: Yes    Alcohol/week: 5.0 standard drinks    Types: 2 Glasses of wine, 2 Cans of beer, 1 Standard drinks or equivalent per week    Comment: 2 times a week; none during pregnancy  . Drug use: No    Allergies:  Allergies  Allergen Reactions  . Sulfa Antibiotics Hives    Medications Prior to Admission  Medication Sig Dispense Refill Last Dose  . alum & mag hydroxide-simeth (MAALOX/MYLANTA) 200-200-20 MG/5ML suspension Take 15 mLs by mouth every 6 (six) hours as needed for indigestion or heartburn.   10/15/2017 at Unknown time  . cetirizine (ZYRTEC) 10 MG tablet Take 10 mg by mouth daily as needed for allergies (itching).   Past Week at Unknown time  . Prenatal Vit-Fe Fumarate-FA (PRENATAL MULTIVITAMIN) TABS tablet Take 1 tablet by mouth daily at 12 noon.   10/14/2017 at Unknown time    Review of Systems  Constitutional: Negative.  Negative for fatigue and fever.  HENT: Negative.   Eyes: Negative for visual disturbance.  Respiratory: Negative.  Negative for shortness of breath.   Cardiovascular: Negative.  Negative for chest pain.  Gastrointestinal: Positive for abdominal pain, nausea and vomiting. Negative for constipation and diarrhea.  Genitourinary: Positive for vaginal bleeding. Negative for dysuria and vaginal discharge.  Neurological: Positive for headaches. Negative for dizziness.   Physical Exam   Blood pressure (!) 145/81, pulse 83, temperature 99.2 F (37.3 C), resp. rate 18, height 5' 6"  (1.676 m), weight 93 kg, unknown if currently breastfeeding.  Patient Vitals for the past 24 hrs:  BP Temp Pulse Resp Height Weight  10/21/17 1831 (!) 145/81 - 83 - - -  10/21/17 1826 (!) 151/87 99.2 F (37.3 C) 90 18 5' 6"  (1.676 m) 93 kg   Physical Exam   Nursing note and vitals reviewed. Constitutional: She is oriented to person, place, and time. She appears well-developed and well-nourished. No distress.  HENT:  Head: Normocephalic.  Eyes: Pupils are equal, round, and reactive to light.  Cardiovascular: Normal rate, regular rhythm and normal heart sounds.  Respiratory: Effort normal and breath sounds normal. No respiratory distress.  GI: Soft. Bowel sounds are normal. She exhibits no distension. There is tenderness in the right upper quadrant and epigastric area. There is guarding.  Neurological: She is alert and oriented to person, place, and time.  Skin: Skin is warm and dry.  Psychiatric: She has a normal mood and affect. Her behavior is normal. Judgment and thought content normal.    MAU Course  Procedures Results for orders placed or performed during the hospital encounter of 10/21/17 (from the past 24 hour(s))  Protein / creatinine ratio, urine     Status: None   Collection Time: 10/21/17  6:50 PM  Result Value Ref Range   Creatinine, Urine 78.00 mg/dL   Total Protein, Urine 7 mg/dL   Protein Creatinine Ratio 0.09 0.00 - 0.15 mg/mg[Cre]  Urinalysis, Routine w reflex microscopic     Status: None   Collection Time: 10/21/17  6:50 PM  Result Value Ref Range   Color, Urine YELLOW YELLOW   APPearance CLEAR CLEAR   Specific Gravity, Urine 1.015 1.005 - 1.030   pH 6.0 5.0 - 8.0   Glucose, UA NEGATIVE NEGATIVE mg/dL   Hgb urine dipstick NEGATIVE NEGATIVE   Bilirubin Urine NEGATIVE NEGATIVE   Ketones, ur NEGATIVE NEGATIVE mg/dL   Protein, ur NEGATIVE NEGATIVE mg/dL   Nitrite NEGATIVE NEGATIVE   Leukocytes, UA NEGATIVE NEGATIVE  CBC     Status: Abnormal   Collection Time: 10/21/17  7:17 PM  Result Value Ref Range   WBC 20.4 (H) 4.0 - 10.5 K/uL   RBC 4.04 3.87 - 5.11 MIL/uL   Hemoglobin 12.5 12.0 - 15.0 g/dL   HCT 37.1 36.0 - 46.0 %   MCV 91.8 78.0 - 100.0 fL   MCH 30.9 26.0 - 34.0 pg   MCHC 33.7 30.0 - 36.0 g/dL   RDW  13.4 11.5 - 15.5 %   Platelets 330 150 - 400 K/uL   MDM UA CBC, CMP, Protein/creat ratio Saline lock Zofran 27m IV Fioricet PO  Care turned over to H. Hogan CNM at 2000.  CWende Mott CNM 10/21/17 8:00 PM  9:07 PM Consult with Dr. LRoyston Sinner reviewed patient's presenting complaint. CMP pending. [place] reviewed CMP from the office at the beginning of August and it was normal, other than elevated Alk Phos and  Bile acids. Will plan for RUQ [place] if labs are normal.  9:38 PM patient is feeling better. No further vomiting. Headache has fully resolved. DW patient that a RUQ [place] is recommended to look for gallstones as this could be a cause of her symptoms. She reports that she is feeling better and would prefer to pursue outpatient [place] if pain returns or continues. This is reasonable. Patient will call her OB or return here if needed if sx worsen, return of fail to improve.   Assessment and Plan   1. Nausea and vomiting, intractability of vomiting not specified, unspecified vomiting type   2. RUQ pain    DC home Comfort measures reviewed  PP pre-eclampsia signs reviewed Low fat diet and bowel management discussed with the patient RX: none  Return to MAU if sx worsen or fail to improve  FU with OB as planned  Follow-up Information    Morris, Megan, DO Follow up.   Specialty:  Obstetrics and Gynecology Contact information: [address], [place], [place] [address] [phone]

## 2017-10-29 ENCOUNTER — Ambulatory Visit: Payer: Self-pay

## 2017-10-29 NOTE — Lactation Note (Signed)
This note was copied from a baby's chart. Lactation Consultation Note  Patient Name: Dawn Faythe DingwallBethany Philipps ZOXWR'UToday's Date: 10/29/2017 Reason for consult: Follow-up assessment;NICU baby;Preterm <34wks Called for a latch assist.  Baby is 35 weeks and 3 days today.  Mom is pumping every 3 hours and obtaining 75-90 mls.  Baby is skin to skin in cross cradle hold and sleepy at breast.  Attempts made to latch but no sucking elicited and baby remained sleepy.  20 mm nipple shield applied.  Baby latched with ease and suckled actively.  Breast massage encouraged during feeding.  Discussed preterm feeding norms.  Encouraged to call for assist/concerns prn.  Maternal Data    Feeding Feeding Type: Breast Fed Nipple Type: Nfant Slow Flow (purple) Length of feed: 20 min  LATCH Score Latch: Grasps breast easily, tongue down, lips flanged, rhythmical sucking.(with nipple shield)  Audible Swallowing: A few with stimulation  Type of Nipple: Everted at rest and after stimulation  Comfort (Breast/Nipple): Soft / non-tender  Hold (Positioning): Assistance needed to correctly position infant at breast and maintain latch.  LATCH Score: 8  Interventions Interventions: Assisted with latch;Breast compression;Skin to skin;Adjust position;Breast massage;Support pillows;Hand express  Lactation Tools Discussed/Used Tools: Nipple Shields Nipple shield size: 20   Consult Status Consult Status: PRN    [name], [name] 10/29/2017, 11:58 AM

## 2018-08-31 ENCOUNTER — Telehealth (HOSPITAL_COMMUNITY): Payer: Self-pay | Admitting: Lactation Services

## 2018-08-31 NOTE — Telephone Encounter (Signed)
Dawn Perez called to ask whether use of Retinol topically is safe when breastfeeding her 66 month old.    [name] at [facility] states that there is minimal absorption and transfer to human milk when used topically.  Advises not to use Retinol orally.

## 2018-12-17 ENCOUNTER — Other Ambulatory Visit: Payer: Self-pay | Admitting: *Deleted

## 2018-12-17 DIAGNOSIS — Z20822 Contact with and (suspected) exposure to covid-19: Secondary | ICD-10-CM

## 2018-12-18 LAB — NOVEL CORONAVIRUS, NAA: SARS-CoV-2, NAA: DETECTED — AB

## 2019-07-15 ENCOUNTER — Other Ambulatory Visit: Payer: Self-pay | Admitting: Family Medicine

## 2019-07-15 DIAGNOSIS — R110 Nausea: Secondary | ICD-10-CM

## 2019-07-19 ENCOUNTER — Ambulatory Visit
Admission: RE | Admit: 2019-07-19 | Discharge: 2019-07-19 | Disposition: A | Discharge status: Home / Self Care | Payer: 59 | Source: Ambulatory Visit | Attending: Family Medicine | Admitting: Family Medicine

## 2019-07-19 DIAGNOSIS — R110 Nausea: Secondary | ICD-10-CM

## 2019-11-26 ENCOUNTER — Inpatient Hospital Stay (HOSPITAL_COMMUNITY)
Admission: AD | Admit: 2019-11-26 | Discharge: 2019-11-26 | Disposition: A | Discharge status: Home / Self Care | Payer: 59 | Attending: Obstetrics and Gynecology | Admitting: Obstetrics and Gynecology

## 2019-11-26 ENCOUNTER — Inpatient Hospital Stay (HOSPITAL_COMMUNITY): Payer: 59

## 2019-11-26 ENCOUNTER — Encounter (HOSPITAL_COMMUNITY): Payer: Self-pay | Admitting: Obstetrics and Gynecology

## 2019-11-26 ENCOUNTER — Other Ambulatory Visit: Payer: Self-pay

## 2019-11-26 DIAGNOSIS — R109 Unspecified abdominal pain: Secondary | ICD-10-CM

## 2019-11-26 DIAGNOSIS — O26891 Other specified pregnancy related conditions, first trimester: Secondary | ICD-10-CM | POA: Diagnosis present

## 2019-11-26 DIAGNOSIS — O209 Hemorrhage in early pregnancy, unspecified: Secondary | ICD-10-CM | POA: Diagnosis not present

## 2019-11-26 DIAGNOSIS — N930 Postcoital and contact bleeding: Secondary | ICD-10-CM | POA: Insufficient documentation

## 2019-11-26 DIAGNOSIS — E785 Hyperlipidemia, unspecified: Secondary | ICD-10-CM | POA: Diagnosis not present

## 2019-11-26 DIAGNOSIS — O3680X0 Pregnancy with inconclusive fetal viability, not applicable or unspecified: Secondary | ICD-10-CM | POA: Diagnosis not present

## 2019-11-26 DIAGNOSIS — Z79899 Other long term (current) drug therapy: Secondary | ICD-10-CM | POA: Insufficient documentation

## 2019-11-26 DIAGNOSIS — Z3A01 Less than 8 weeks gestation of pregnancy: Secondary | ICD-10-CM | POA: Diagnosis not present

## 2019-11-26 DIAGNOSIS — O99281 Endocrine, nutritional and metabolic diseases complicating pregnancy, first trimester: Secondary | ICD-10-CM | POA: Diagnosis not present

## 2019-11-26 DIAGNOSIS — O99891 Other specified diseases and conditions complicating pregnancy: Secondary | ICD-10-CM

## 2019-11-26 DIAGNOSIS — R1030 Lower abdominal pain, unspecified: Secondary | ICD-10-CM | POA: Diagnosis not present

## 2019-11-26 LAB — CBC
HCT: 41.8 % (ref 36.0–46.0)
Hemoglobin: 13.6 g/dL (ref 12.0–15.0)
MCH: 29.5 pg (ref 26.0–34.0)
MCHC: 32.5 g/dL (ref 30.0–36.0)
MCV: 90.7 fL (ref 80.0–100.0)
Platelets: 304 10*3/uL (ref 150–400)
RBC: 4.61 MIL/uL (ref 3.87–5.11)
RDW: 12.5 % (ref 11.5–15.5)
WBC: 10.2 10*3/uL (ref 4.0–10.5)
nRBC: 0 % (ref 0.0–0.2)

## 2019-11-26 LAB — HCG, QUANTITATIVE, PREGNANCY: hCG, Beta Chain, Quant, S: 9873 m[IU]/mL — ABNORMAL HIGH (ref <5)

## 2019-11-26 LAB — URINALYSIS, ROUTINE W REFLEX MICROSCOPIC
Bilirubin Urine: NEGATIVE
Glucose, UA: NEGATIVE mg/dL
Hgb urine dipstick: NEGATIVE
Ketones, ur: NEGATIVE mg/dL
Leukocytes,Ua: NEGATIVE
Nitrite: NEGATIVE
Protein, ur: NEGATIVE mg/dL
Specific Gravity, Urine: 1.006 (ref 1.005–1.030)
pH: 7 (ref 5.0–8.0)

## 2019-11-26 LAB — POCT PREGNANCY, URINE: Preg Test, Ur: POSITIVE — AB

## 2019-11-26 NOTE — MAU Note (Signed)
.   Dawn Perez is a 34 y.o. at [redacted]w[redacted]d here in MAU reporting: small amount of vaginal bleeding with lower abdominal cramping that started this morning. Last intercourse last night.  LMP:10/12/19 Onset of complaint: this morning Pain score: 2 Vitals:   11/26/19 1446  BP: 120/67  Pulse: 73  Resp: 16  Temp: 98.7 F (37.1 C)  SpO2: 100%     FHT: Lab orders placed from triage: UA/UPT

## 2019-11-26 NOTE — Discharge Instructions (Signed)
Return to care   If you have heavier bleeding that soaks through more that 2 pads per hour for an hour or more  If you bleed so much that you feel like you might pass out or you do pass out  If you have significant abdominal pain that is not improved with Tylenol     Vaginal Bleeding During Pregnancy, First Trimester  A small amount of bleeding from the vagina (spotting) is relatively common during early pregnancy. It usually stops on its own. Various things may cause bleeding or spotting during early pregnancy. Some bleeding may be related to the pregnancy, and some may not. In many cases, the bleeding is normal and is not a problem. However, bleeding can also be a sign of something serious. Be sure to tell your health care provider about any vaginal bleeding right away. Some possible causes of vaginal bleeding during the first trimester include:  Infection or inflammation of the cervix.  Growths (polyps) on the cervix.  Miscarriage or threatened miscarriage.  Pregnancy tissue developing outside of the uterus (ectopic pregnancy).  A mass of tissue developing in the uterus due to an egg being fertilized incorrectly (molar pregnancy). Follow these instructions at home: Activity  Follow instructions from your health care provider about limiting your activity. Ask what activities are safe for you.  If needed, make plans for someone to help with your regular activities.  Do not have sex or orgasms until your health care provider says that this is safe. General instructions  Take over-the-counter and prescription medicines only as told by your health care provider.  Pay attention to any changes in your symptoms.  Do not use tampons or douche.  Write down how many pads you use each day, how often you change pads, and how soaked (saturated) they are.  If you pass any tissue from your vagina, save the tissue so you can show it to your health care provider.  Keep all follow-up  visits as told by your health care provider. This is important. Contact a health care provider if:  You have vaginal bleeding during any part of your pregnancy.  You have cramps or labor pains.  You have a fever. Get help right away if:  You have severe cramps in your back or abdomen.  You pass large clots or a large amount of tissue from your vagina.  Your bleeding increases.  You feel light-headed or weak, or you faint.  You have chills.  You are leaking fluid or have a gush of fluid from your vagina. Summary  A small amount of bleeding (spotting) from the vagina is relatively common during early pregnancy.  Various things may cause bleeding or spotting in early pregnancy.  Be sure to tell your health care provider about any vaginal bleeding right away. This information is not intended to replace advice given to you by your health care provider. Make sure you discuss any questions you have with your health care provider. Document Revised: 06/12/2018 Document Reviewed: 05/26/2016 Elsevier Patient Education  2020 [employer] Inc.   

## 2019-11-26 NOTE — MAU Provider Note (Signed)
Chief Complaint: Vaginal Bleeding and Possible Pregnancy   First Provider Initiated Contact with Patient 11/26/19 1537     SUBJECTIVE HPI: Dawn Perez is a 34 y.o. G2P0101 at [redacted]w[redacted]d who presents to Maternity Admissions reporting abdominal cramping & vaginal bleeding. Symptoms started this morning. Reports mild abdominal cramping throughout lower abdomen that she rates 2/10. Has seen some bright red blood on toilet paper. Not bleeding into a pad or passing blood clots. Had intercourse last night. Has appointment with [facility] next week.   Location: abdomen Quality: cramping Severity: 2/10 on pain scale Duration: <1 day Timing: intermittent Modifying factors: none Associated signs and symptoms: vaginal bleeding  Past Medical History:  Diagnosis Date  . Boil of buttock 08/06/2014  . Hyperlipidemia    OB History  Gravida Para Term Preterm AB Living  2 1 0 1 0 1  SAB TAB Ectopic Multiple Live Births  0 0 0 0 1    # Outcome Date GA Lbr Len/2nd Weight Sex Delivery Anes PTL Lv  2 Current           1 Preterm 10/18/17 [redacted]w[redacted]d 08:04 / 00:24 1960 g M Vag-Spont EPI  LIV   Past Surgical History:  Procedure Laterality Date  . WISDOM TOOTH EXTRACTION     Social History   Socioeconomic History  . Marital status: Married    Spouse name: Not on file  . Number of children: Not on file  . Years of education: Not on file  . Highest education level: Not on file  Occupational History  . Not on file  Tobacco Use  . Smoking status: Never Smoker  . Smokeless tobacco: Never Used  Substance and Sexual Activity  . Alcohol use: Yes    Alcohol/week: 5.0 standard drinks    Types: 2 Glasses of wine, 2 Cans of beer, 1 Standard drinks or equivalent per week    Comment: 2 times a week; none during pregnancy  . Drug use: No  . Sexual activity: Yes    Birth control/protection: Condom, Pill  Other Topics Concern  . Not on file  Social History Narrative  . Not on file   Social  Determinants of Health   Financial Resource Strain:   . Difficulty of Paying Living Expenses: Not on file  Food Insecurity:   . Worried About Programme researcher, broadcasting/film/video in the Last Year: Not on file  . Ran Out of Food in the Last Year: Not on file  Transportation Needs:   . Lack of Transportation (Medical): Not on file  . Lack of Transportation (Non-Medical): Not on file  Physical Activity:   . Days of Exercise per Week: Not on file  . Minutes of Exercise per Session: Not on file  Stress:   . Feeling of Stress : Not on file  Social Connections:   . Frequency of Communication with Friends and Family: Not on file  . Frequency of Social Gatherings with Friends and Family: Not on file  . Attends Religious Services: Not on file  . Active Member of Clubs or Organizations: Not on file  . Attends Banker Meetings: Not on file  . Marital Status: Not on file  Intimate Partner Violence:   . Fear of Current or Ex-Partner: Not on file  . Emotionally Abused: Not on file  . Physically Abused: Not on file  . Sexually Abused: Not on file   Family History  Problem Relation Age of Onset  . Hypertension Mother   .  Hyperlipidemia Mother   . Autoimmune disease Father 30       Mixed connective tissue disorder  . Heart disease Maternal Grandfather   . Stroke Maternal Grandfather   . Cancer Other        breast x2 aunts  . Stroke Paternal Grandmother   . COPD Paternal Grandfather   . Cancer Maternal Aunt    No current facility-administered medications on file prior to encounter.   Current Outpatient Medications on File Prior to Encounter  Medication Sig Dispense Refill  . Prenatal Vit-Fe Fumarate-FA (PRENATAL MULTIVITAMIN) TABS tablet Take 1 tablet by mouth daily at 12 noon.    Marland Kitchen erythromycin ophthalmic ointment SMARTSIG:1 CENTIMETER(S) In Eye(s) 3 Times Daily     Allergies  Allergen Reactions  . Sulfa Antibiotics Hives    I have reviewed patient's Past Medical Hx, Surgical Hx,  Family Hx, Social Hx, medications and allergies.   Review of Systems  Constitutional: Negative.   Gastrointestinal: Positive for abdominal pain. Negative for diarrhea, nausea and vomiting.  Genitourinary: Positive for vaginal bleeding. Negative for dysuria and vaginal discharge.    OBJECTIVE Patient Vitals for the past 24 hrs:  BP Temp Pulse Resp SpO2 Height Weight  11/26/19 1806 118/70 -- 74 16 -- -- --  11/26/19 1446 120/67 98.7 F (37.1 C) 73 16 100 % 5\' 6"  (1.676 m) 82.1 kg   Constitutional: Well-developed, well-nourished female in no acute distress.  Cardiovascular: normal rate & rhythm, no murmur Respiratory: normal rate and effort. Lung sounds clear throughout GI: Abd soft, non-tender, Pos BS x 4. No guarding or rebound tenderness MS: Extremities nontender, no edema, normal ROM Neurologic: Alert and oriented x 4.  GU:     SPECULUM EXAM: NEFG, scant amount of dark red mucoid blood at os  BIMANUAL: No CMT. cervix closed; uterus normal size, no adnexal tenderness or masses.    LAB RESULTS Results for orders placed or performed during the hospital encounter of 11/26/19 (from the past 24 hour(s))  Pregnancy, urine POC     Status: Abnormal   Collection Time: 11/26/19  2:28 PM  Result Value Ref Range   Preg Test, Ur POSITIVE (A) NEGATIVE  Urinalysis, Routine w reflex microscopic Urine, Clean Catch     Status: Abnormal   Collection Time: 11/26/19  3:30 PM  Result Value Ref Range   Color, Urine STRAW (A) YELLOW   APPearance CLEAR CLEAR   Specific Gravity, Urine 1.006 1.005 - 1.030   pH 7.0 5.0 - 8.0   Glucose, UA NEGATIVE NEGATIVE mg/dL   Hgb urine dipstick NEGATIVE NEGATIVE   Bilirubin Urine NEGATIVE NEGATIVE   Ketones, ur NEGATIVE NEGATIVE mg/dL   Protein, ur NEGATIVE NEGATIVE mg/dL   Nitrite NEGATIVE NEGATIVE   Leukocytes,Ua NEGATIVE NEGATIVE  CBC     Status: None   Collection Time: 11/26/19  4:02 PM  Result Value Ref Range   WBC 10.2 4.0 - 10.5 K/uL   RBC 4.61 3.87 -  5.11 MIL/uL   Hemoglobin 13.6 12.0 - 15.0 g/dL   HCT 11/28/19 36 - 46 %   MCV 90.7 80.0 - 100.0 fL   MCH 29.5 26.0 - 34.0 pg   MCHC 32.5 30.0 - 36.0 g/dL   RDW 30.0 92.3 - 30.0 %   Platelets 304 150 - 400 K/uL   nRBC 0.0 0.0 - 0.2 %  hCG, quantitative, pregnancy     Status: Abnormal   Collection Time: 11/26/19  4:02 PM  Result Value Ref Range  hCG, Beta Chain, Quant, S 9,873 (H) <5 mIU/mL    IMAGING US OB LESS THAN 14 WEEKS WITH OB TRANSVAGINAL  Result Date: 11/26/2019 CLINICAL DATA:  Abdominal cramping, vaginal bleeding EXAM: OBSTETRIC <14 WK [place] AND TRANSVAGINAL OB US TECHNIQUE: Both transabdominal and transvaginal ultrasound examinations were performed for complete evaluation of the gestation as well as the maternal uterus, adnexal regions, and pelvic cul-de-sac. Transvaginal technique was performed to assess early pregnancy. COMPARISON:  None. FINDINGS: Intrauterine gestational sac: Single Yolk sac:  Not Visualized. Embryo:  Not Visualized. Cardiac Activity: Not Visualized. MSD: 12.4 mm   6 w   0 d Subchorionic hemorrhage:  None visualized. Maternal uterus/adnexae: Right ovary measures 2.2 x 2.4 x 1.4 cm and the left ovary measures 1.9 x 2.6 by 1.6 cm. Corpus luteum cyst noted within the left ovary. No free fluid. IMPRESSION: 1. Probable early intrauterine gestational sac with an estimated age of 6 weeks and 0 days, but no yolk sac, fetal pole, or cardiac activity yet visualized. Recommend follow-up quantitative B-HCG levels and follow-up US in 14 days to assess viability. This recommendation follows SRU consensus guidelines: Diagnostic Criteria for Nonviable Pregnancy Early in the First Trimester. Malva Limes Med 2013; 809:9833-82. Electronically Signed   By: Sharlet Salina M.D.   On: 11/26/2019 17:28    MAU COURSE Orders Placed This Encounter  Procedures  . US OB LESS THAN 14 WEEKS WITH OB TRANSVAGINAL  . Urinalysis, Routine w reflex microscopic Urine, Clean Catch  . CBC  . hCG,  quantitative, pregnancy  . Diet NPO time specified  . Pregnancy, urine POC  . Discharge patient   No orders of the defined types were placed in this encounter.   MDM +UPT UA, wet prep, GC/chlamydia, CBC, ABO/Rh, quant hCG, and [place] today to rule out ectopic pregnancy which can be life threatening.   RH positive  Ultrasound shows IUGS with no YS. No adnexal masses. HCG today is 9873.   Called Dr. Rana Snare to notify of PUL. Want patient to have ultrasound later this week & see her primary.   ASSESSMENT 1. Pregnancy of unknown anatomic location   2. Vaginal bleeding in pregnancy, first trimester   3. Abdominal cramping affecting pregnancy   4. Postcoital bleeding     PLAN Discharge home in stable condition. SAB vs ectopic precautions Left msg with [facility] to schedule ultrasound & appt with Dr. Langston Masker later this week; pt instructed to call the office in the morning    Follow-up Information    [facility] Follow up.   Why: Call office tomorrow morning for follow up ultrasound later this week Contact information: [address] [place] [address] [phone]              Allergies as of 11/26/2019      Reactions   Sulfa Antibiotics Hives      Medication List    STOP taking these medications   alum & mag hydroxide-simeth 200-200-20 MG/5ML suspension Commonly known as: MAALOX/MYLANTA   cetirizine 10 MG tablet Commonly known as: ZYRTEC     TAKE these medications   erythromycin ophthalmic ointment SMARTSIG:1 CENTIMETER(S) In Eye(s) 3 Times Daily   prenatal multivitamin Tabs tablet Take 1 tablet by mouth daily at 12 noon.        Judeth Horn, NP 11/26/2019  7:50 PM

## 2020-10-07 ENCOUNTER — Other Ambulatory Visit: Payer: Self-pay

## 2020-10-07 ENCOUNTER — Emergency Department (HOSPITAL_COMMUNITY)
Admission: AD | Admit: 2020-10-07 | Discharge: 2020-10-08 | Disposition: A | Discharge status: Home / Self Care | Payer: 59 | Attending: Obstetrics and Gynecology | Admitting: Obstetrics and Gynecology

## 2020-10-07 ENCOUNTER — Encounter (HOSPITAL_COMMUNITY): Payer: Self-pay | Admitting: *Deleted

## 2020-10-07 DIAGNOSIS — O26891 Other specified pregnancy related conditions, first trimester: Secondary | ICD-10-CM | POA: Diagnosis present

## 2020-10-07 DIAGNOSIS — R079 Chest pain, unspecified: Secondary | ICD-10-CM | POA: Diagnosis not present

## 2020-10-07 DIAGNOSIS — Z20822 Contact with and (suspected) exposure to covid-19: Secondary | ICD-10-CM | POA: Insufficient documentation

## 2020-10-07 DIAGNOSIS — Z3A01 Less than 8 weeks gestation of pregnancy: Secondary | ICD-10-CM | POA: Insufficient documentation

## 2020-10-07 LAB — CBC WITH DIFFERENTIAL/PLATELET
Abs Immature Granulocytes: 0.04 10*3/uL (ref 0.00–0.07)
Basophils Absolute: 0.1 10*3/uL (ref 0.0–0.1)
Basophils Relative: 1 %
Eosinophils Absolute: 0.2 10*3/uL (ref 0.0–0.5)
Eosinophils Relative: 2 %
HCT: 39.8 % (ref 36.0–46.0)
Hemoglobin: 13.6 g/dL (ref 12.0–15.0)
Immature Granulocytes: 0 %
Lymphocytes Relative: 23 %
Lymphs Abs: 2.6 10*3/uL (ref 0.7–4.0)
MCH: 31 pg (ref 26.0–34.0)
MCHC: 34.2 g/dL (ref 30.0–36.0)
MCV: 90.7 fL (ref 80.0–100.0)
Monocytes Absolute: 0.9 10*3/uL (ref 0.1–1.0)
Monocytes Relative: 8 %
Neutro Abs: 7.5 10*3/uL (ref 1.7–7.7)
Neutrophils Relative %: 66 %
Platelets: 302 10*3/uL (ref 150–400)
RBC: 4.39 MIL/uL (ref 3.87–5.11)
RDW: 12.2 % (ref 11.5–15.5)
WBC: 11.3 10*3/uL — ABNORMAL HIGH (ref 4.0–10.5)
nRBC: 0 % (ref 0.0–0.2)

## 2020-10-07 LAB — COMPREHENSIVE METABOLIC PANEL
ALT: 19 U/L (ref 0–44)
AST: 16 U/L (ref 15–41)
Albumin: 3.9 g/dL (ref 3.5–5.0)
Alkaline Phosphatase: 67 U/L (ref 38–126)
Anion gap: 7 (ref 5–15)
BUN: 10 mg/dL (ref 6–20)
CO2: 24 mmol/L (ref 22–32)
Calcium: 9.7 mg/dL (ref 8.9–10.3)
Chloride: 103 mmol/L (ref 98–111)
Creatinine, Ser: 0.78 mg/dL (ref 0.44–1.00)
GFR, Estimated: >60 mL/min (ref >60)
Glucose, Bld: 110 mg/dL — ABNORMAL HIGH (ref 70–99)
Potassium: 3.9 mmol/L (ref 3.5–5.1)
Sodium: 134 mmol/L — ABNORMAL LOW (ref 135–145)
Total Bilirubin: 0.4 mg/dL (ref 0.3–1.2)
Total Protein: 7 g/dL (ref 6.5–8.1)

## 2020-10-07 LAB — POCT PREGNANCY, URINE: Preg Test, Ur: POSITIVE — AB

## 2020-10-07 LAB — TROPONIN I (HIGH SENSITIVITY)
Troponin I (High Sensitivity): 3 ng/L (ref <18)
Troponin I (High Sensitivity): 3 ng/L (ref <18)

## 2020-10-07 NOTE — MAU Provider Note (Signed)
[name] is a 35 y.o. G3P0101 at [redacted]w[redacted]d gestation who presents to MAU today with complaint of chest pain. She states that her symptoms initially started a few weeks ago around 7/15 when her husband was diagnosed with COVID. She reports testing negative at that time for COVID but started having chest pain on and off and some shortness of breath from time to time. Yesterday, she had pain radiate into her left arm and today her chest pain has been constant. She reports having some shortness of breath with exertion but this is mild. She denies any obstetric complaints today including vaginal bleeding or pelvic pain. She initially tried to be seen in urgent care but they told her to go to the ED for further evaluation. She subsequently came to MAU for further evaluation.  O BP (!) 122/59   Pulse 79   Temp 98.6 F (37 C)   Resp 18   Ht 5\' 6"  (1.676 m)   Wt 81.2 kg   LMP 08/28/2020   SpO2 99%   Breastfeeding Unknown   BMI 28.89 kg/m   Physical Exam Vitals reviewed.  Constitutional:      General: She is not in acute distress. HENT:     Head: Normocephalic and atraumatic.  Cardiovascular:     Rate and Rhythm: Normal rate and regular rhythm.     Heart sounds: No murmur heard. Pulmonary:     Effort: Pulmonary effort is normal. No respiratory distress.     Breath sounds: Normal breath sounds.  Skin:    General: Skin is warm and dry.  Neurological:     General: No focal deficit present.     Mental Status: She is alert and oriented to person, place, and time.  Psychiatric:        Mood and Affect: Mood normal.        Behavior: Behavior normal.   A 1. Chest pain in adult Medical screening exam complete. Atypical chest pain in the setting of recent COVID; however, now her pain is constant and associated with SOB on exertion. HDS and exam as above reassuring. Patient needs further chest pain workup including EKG and labs. Discussed case with ED provider who agreed with patient  transfer to [facility] Adult ED for further evaluation.  P Discharge from MAU in stable condition. Transfer to [facility] Adult ED.  Patient may return to MAU as needed for pregnancy related complaints.  [facility], MD Obstetrics Fellow 10/07/2020 5:39 PM

## 2020-10-07 NOTE — MAU Note (Signed)
MSE screening by Dr. Mathis Fare. VSS. Stable for transport.Report called to ED Triage nurse. Pt transferred for further evaluation.

## 2020-10-07 NOTE — ED Triage Notes (Signed)
Pt c/o intermittent chest pain, pain worsened last night and became constant today, denies shortness of breath.

## 2020-10-07 NOTE — MAU Note (Signed)
[redacted] weeks pregnant. Pt reports her husband tested positive for covid on 09/18/20. She tested negative but has been having chest pain on and off since. Last night the chest pain got worse and is constant today. Had left arm pain last night but none now. Has had some SOB but not much today Or currently. Denies any cough,fever chills.

## 2020-10-08 LAB — RESP PANEL BY RT-PCR (FLU A&B, COVID) ARPGX2
Influenza A by PCR: NEGATIVE
Influenza B by PCR: NEGATIVE
SARS Coronavirus 2 by RT PCR: NEGATIVE

## 2020-10-08 NOTE — Discharge Instructions (Addendum)
Your COVID/influenza test is pending and the results can be obtained through MyChart in 2-4 hours.   Your tests are negative for any serious or life threatening condition. Please follow up with your doctor to discuss your visit today and any further management in the outpatient setting.   Return to the ED with any new or concerning symptoms.

## 2020-10-08 NOTE — ED Provider Notes (Signed)
[facility] EMERGENCY DEPARTMENT Provider Note   CSN: [id number] Arrival date & time: 10/07/20  1747     History Chief Complaint  Patient presents with   Chest Pain    Dawn Perez is a 35 y.o. female.  Patient to ED for evaluation of chest pain that started as intermittent pain 3 weeks ago, today was more constant. Pain is localized to the left sternal border and radiates into the back. No vomiting. No SOB, and deep breathing does not make the pain worse. No cough, fever. She reports her husband was COVID positive 3 weeks ago, and she developed symptoms of congestion/cough that have resolved except for the described chest pain. She reports her repeated COVID tests have been negative. She is about [redacted] weeks pregnant but denies low abdominal pain, vaginal bleeding.   The history is provided by the patient. No language interpreter was used.  Chest Pain Associated symptoms: no abdominal pain, no cough, no fever and no shortness of breath       Past Medical History:  Diagnosis Date   Boil of buttock 08/06/2014   Hyperlipidemia     Patient Active Problem List   Diagnosis Date Noted   Preterm premature rupture of membranes (PPROM) with unknown onset of labor 10/15/2017   Boil of buttock 08/06/2014   Breast nodule 12/18/2013    Past Surgical History:  Procedure Laterality Date   WISDOM TOOTH EXTRACTION       OB History     Gravida  3   Para  1   Term  0   Preterm  1   AB  0   Living  1      SAB  0   IAB  0   Ectopic  0   Multiple  0   Live Births  1           Family History  Problem Relation Age of Onset   Hypertension Mother    Hyperlipidemia Mother    Autoimmune disease Father 62       Mixed connective tissue disorder   Heart disease Maternal Grandfather    Stroke Maternal Grandfather    Cancer Other        breast x2 aunts   Stroke Paternal Grandmother    COPD Paternal Grandfather    Cancer Maternal Aunt     Social  History   Tobacco Use   Smoking status: Never   Smokeless tobacco: Never  Substance Use Topics   Alcohol use: Yes    Alcohol/week: 5.0 standard drinks    Types: 2 Glasses of wine, 2 Cans of beer, 1 Standard drinks or equivalent per week    Comment: 2 times a week; none during pregnancy   Drug use: No    Home Medications Prior to Admission medications   Medication Sig Start Date End Date Taking? Authorizing Provider  erythromycin ophthalmic ointment SMARTSIG:1 CENTIMETER(S) In Eye(s) 3 Times Daily 11/24/19   [provider]  Prenatal Vit-Fe Fumarate-FA (PRENATAL MULTIVITAMIN) TABS tablet Take 1 tablet by mouth daily at 12 noon.    [provider]    Allergies    Sulfa antibiotics  Review of Systems   Review of Systems  Constitutional:  Negative for chills and fever.  HENT: Negative.    Respiratory: Negative.  Negative for cough and shortness of breath.   Cardiovascular:  Positive for chest pain.  Gastrointestinal: Negative.  Negative for abdominal pain.  Genitourinary:  Negative for vaginal bleeding.  Musculoskeletal: Negative.   Skin: Negative.   Neurological: Negative.    Physical Exam Updated Vital Signs BP 128/72   Pulse 69   Temp 98.8 F (37.1 C) (Oral)   Resp 16   Ht 5\' 6"  (1.676 m)   Wt 81.2 kg   LMP 08/28/2020   SpO2 100%   Breastfeeding Unknown   BMI 28.89 kg/m   Physical Exam Vitals and nursing note reviewed.  Constitutional:      Appearance: She is well-developed.  HENT:     Head: Normocephalic.  Cardiovascular:     Rate and Rhythm: Normal rate and regular rhythm.     Heart sounds: No murmur heard. Pulmonary:     Effort: Pulmonary effort is normal.     Breath sounds: Normal breath sounds. No wheezing, rhonchi or rales.  Chest:     Chest wall: No tenderness.  Abdominal:     General: Bowel sounds are normal.     Palpations: Abdomen is soft.     Tenderness: There is no abdominal tenderness. There is no guarding or rebound.   Musculoskeletal:        General: Normal range of motion.     Cervical back: Normal range of motion and neck supple.  Skin:    General: Skin is warm and dry.  Neurological:     General: No focal deficit present.     Mental Status: She is alert and oriented to person, place, and time.    ED Results / Procedures / Treatments   Labs (all labs ordered are listed, but only abnormal results are displayed) Labs Reviewed  CBC WITH DIFFERENTIAL/PLATELET - Abnormal; Notable for the following components:      Result Value   WBC 11.3 (*)    All other components within normal limits  COMPREHENSIVE METABOLIC PANEL - Abnormal; Notable for the following components:   Sodium 134 (*)    Glucose, Bld 110 (*)    All other components within normal limits  POCT PREGNANCY, URINE - Abnormal; Notable for the following components:   Preg Test, Ur POSITIVE (*)    All other components within normal limits  TROPONIN I (HIGH SENSITIVITY)  TROPONIN I (HIGH SENSITIVITY)   Results for orders placed or performed during the hospital encounter of 10/07/20  CBC with Differential  Result Value Ref Range   WBC 11.3 (H) 4.0 - 10.5 K/uL   RBC 4.39 3.87 - 5.11 MIL/uL   Hemoglobin 13.6 12.0 - 15.0 g/dL   HCT 12/07/20 44.0 - 34.7 %   MCV 90.7 80.0 - 100.0 fL   MCH 31.0 26.0 - 34.0 pg   MCHC 34.2 30.0 - 36.0 g/dL   RDW 42.5 95.6 - 38.7 %   Platelets 302 150 - 400 K/uL   nRBC 0.0 0.0 - 0.2 %   Neutrophils Relative % 66 %   Neutro Abs 7.5 1.7 - 7.7 K/uL   Lymphocytes Relative 23 %   Lymphs Abs 2.6 0.7 - 4.0 K/uL   Monocytes Relative 8 %   Monocytes Absolute 0.9 0.1 - 1.0 K/uL   Eosinophils Relative 2 %   Eosinophils Absolute 0.2 0.0 - 0.5 K/uL   Basophils Relative 1 %   Basophils Absolute 0.1 0.0 - 0.1 K/uL   Immature Granulocytes 0 %   Abs Immature Granulocytes 0.04 0.00 - 0.07 K/uL  Comprehensive metabolic panel  Result Value Ref Range   Sodium 134 (L) 135 - 145 mmol/L   Potassium 3.9 3.5 - 5.1 mmol/L  Chloride 103 98 - 111 mmol/L   CO2 24 22 - 32 mmol/L   Glucose, Bld 110 (H) 70 - 99 mg/dL   BUN 10 6 - 20 mg/dL   Creatinine, Ser 8.93 0.44 - 1.00 mg/dL   Calcium 9.7 8.9 - 81.0 mg/dL   Total Protein 7.0 6.5 - 8.1 g/dL   Albumin 3.9 3.5 - 5.0 g/dL   AST 16 15 - 41 U/L   ALT 19 0 - 44 U/L   Alkaline Phosphatase 67 38 - 126 U/L   Total Bilirubin 0.4 0.3 - 1.2 mg/dL   GFR, Estimated >17 >51 mL/min   Anion gap 7 5 - 15  Pregnancy, urine POC  Result Value Ref Range   Preg Test, Ur POSITIVE (A) NEGATIVE  Troponin I (High Sensitivity)  Result Value Ref Range   Troponin I (High Sensitivity) 3 <18 ng/L  Troponin I (High Sensitivity)  Result Value Ref Range   Troponin I (High Sensitivity) 3 <18 ng/L    EKG None EKG: normal EKG, normal sinus rhythm.  Radiology No results found.  Procedures Procedures   Medications Ordered in ED Medications - No data to display  ED Course  I have reviewed the triage vital signs and the nursing notes.  Pertinent labs & imaging results that were available during my care of the patient were reviewed by me and considered in my medical decision making (see chart for details).    MDM Rules/Calculators/A&P                           Patient to ED with chest pain as described in the HPI.   She is very well appearing. In NAD. VSS. Recent COVID exposure with improving or resolved symptoms. No fever at any time. COVID collected and is pending. No tachycardia, SOB, hypoxia - doubt PE. CXR deferred due to pregnancy but lungs clear, no SOB - doubt PNA/infection. Troponin x 2 negative, normal EKG - doubt ACS.   She can be discharged home. Recommend follow up with OB/GYN to discuss medication for possible GI source. 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   Final Clinical Impression(s) / ED Diagnoses Final diagnoses:  Chest pain in adult   Nonspecific chest pain  Rx / DC Orders ED Discharge Orders     None         Danne Harbor 10/08/20 0205    Dione Booze, MD 10/08/20 [phone]

## 2020-11-13 LAB — OB RESULTS CONSOLE GC/CHLAMYDIA
Chlamydia: NEGATIVE
Gonorrhea: NEGATIVE

## 2020-11-13 LAB — OB RESULTS CONSOLE ABO/RH: RH Type: POSITIVE

## 2020-11-13 LAB — OB RESULTS CONSOLE HEPATITIS B SURFACE ANTIGEN: Hepatitis B Surface Ag: NEGATIVE

## 2020-11-13 LAB — OB RESULTS CONSOLE RUBELLA ANTIBODY, IGM: Rubella: IMMUNE

## 2020-11-13 LAB — OB RESULTS CONSOLE RPR: RPR: NONREACTIVE

## 2020-11-13 LAB — OB RESULTS CONSOLE ANTIBODY SCREEN: Antibody Screen: NEGATIVE

## 2020-11-13 LAB — OB RESULTS CONSOLE HIV ANTIBODY (ROUTINE TESTING): HIV: NONREACTIVE

## 2021-03-07 NOTE — L&D Delivery Note (Signed)
Delivery Note ?At 9:25 AM a viable female was delivered via Vaginal, Spontaneous (Presentation: Right Occiput Anterior).  APGAR: 9, 9; weight  .   ?Placenta status: Spontaneous, Intact.  Cord: 3 vessels with the following complications: None.  Cord pH: na ? ?Anesthesia: None ?Episiotomy: None ?Lacerations: None ?Suture Repair:  n/a ?Est. Blood Loss (mL): 200 ? ?Mom to postpartum.  Baby to Couplet care / Skin to Skin. ? ?Lendon Colonel ?05/07/2021, 9:53 AM ? ? ? ?

## 2021-04-25 ENCOUNTER — Other Ambulatory Visit: Payer: Self-pay

## 2021-04-25 ENCOUNTER — Inpatient Hospital Stay (HOSPITAL_COMMUNITY)
Admission: AD | Admit: 2021-04-25 | Discharge: 2021-04-25 | Disposition: A | Discharge status: Home / Self Care | Payer: 59 | Source: Ambulatory Visit | Attending: Obstetrics | Admitting: Obstetrics

## 2021-04-25 ENCOUNTER — Encounter (HOSPITAL_COMMUNITY): Payer: Self-pay | Admitting: Obstetrics

## 2021-04-25 DIAGNOSIS — O26893 Other specified pregnancy related conditions, third trimester: Secondary | ICD-10-CM | POA: Insufficient documentation

## 2021-04-25 DIAGNOSIS — O4703 False labor before 37 completed weeks of gestation, third trimester: Secondary | ICD-10-CM | POA: Insufficient documentation

## 2021-04-25 DIAGNOSIS — O09213 Supervision of pregnancy with history of pre-term labor, third trimester: Secondary | ICD-10-CM | POA: Diagnosis not present

## 2021-04-25 DIAGNOSIS — O09523 Supervision of elderly multigravida, third trimester: Secondary | ICD-10-CM | POA: Diagnosis not present

## 2021-04-25 DIAGNOSIS — O479 False labor, unspecified: Secondary | ICD-10-CM

## 2021-04-25 DIAGNOSIS — R059 Cough, unspecified: Secondary | ICD-10-CM | POA: Insufficient documentation

## 2021-04-25 DIAGNOSIS — Z3A34 34 weeks gestation of pregnancy: Secondary | ICD-10-CM | POA: Insufficient documentation

## 2021-04-25 DIAGNOSIS — Z8719 Personal history of other diseases of the digestive system: Secondary | ICD-10-CM | POA: Insufficient documentation

## 2021-04-25 DIAGNOSIS — R1030 Lower abdominal pain, unspecified: Secondary | ICD-10-CM | POA: Insufficient documentation

## 2021-04-25 DIAGNOSIS — O99513 Diseases of the respiratory system complicating pregnancy, third trimester: Secondary | ICD-10-CM | POA: Insufficient documentation

## 2021-04-25 LAB — URINALYSIS, ROUTINE W REFLEX MICROSCOPIC
Bilirubin Urine: NEGATIVE
Glucose, UA: NEGATIVE mg/dL
Hgb urine dipstick: NEGATIVE
Ketones, ur: NEGATIVE mg/dL
Leukocytes,Ua: NEGATIVE
Nitrite: NEGATIVE
Protein, ur: NEGATIVE mg/dL
Specific Gravity, Urine: 1.01 (ref 1.005–1.030)
pH: 6.5 (ref 5.0–8.0)

## 2021-04-25 MED ORDER — BENZONATATE 100 MG PO CAPS: 200.0000 mg | ORAL_CAPSULE | Freq: Three times a day (TID) | ORAL | 0 refills | Status: AC | PRN

## 2021-04-25 NOTE — MAU Provider Note (Addendum)
History     CSN: [id number]  Arrival date and time: 04/25/21 1757   Event Date/Time   First Provider Initiated Contact with Patient 04/25/21 1827      Chief Complaint  Patient presents with   Abdominal Pain   HPI  [name] is a 36 y.o. female [phone] @ [redacted]w[redacted]d here in MAU with lower abdominal cramping. The cramping started today. She reports a cough that started last week and was treated by her ob with a Z-pack.  Abdominal pain comes and goes. When the pain comes she rates the pain 1/10. She has a history of preterm delivery at 33 weeks which is the main reason she came in. She wants to be sure everything is ok.  Patient reports going into labor and her water broke. She is receiving 17 P. She denies upper abdominal pain. Hx of cholestasis,  + fetal movement.   No recent intercourse.   OB History     Gravida  3   Para  1   Term  0   Preterm  1   AB  1   Living  1      SAB  1   IAB  0   Ectopic  0   Multiple  0   Live Births  1           Past Medical History:  Diagnosis Date   Boil of buttock 08/06/2014   Hyperlipidemia     Past Surgical History:  Procedure Laterality Date   WISDOM TOOTH EXTRACTION      Family History  Problem Relation Age of Onset   Hypertension Mother    Hyperlipidemia Mother    Autoimmune disease Father 89       Mixed connective tissue disorder   Heart disease Maternal Grandfather    Stroke Maternal Grandfather    Cancer Other        breast x2 aunts   Stroke Paternal Grandmother    COPD Paternal Grandfather    Cancer Maternal Aunt     Social History   Tobacco Use   Smoking status: Never   Smokeless tobacco: Never  Vaping Use   Vaping Use: Never used  Substance Use Topics   Alcohol use: Not Currently    Alcohol/week: 5.0 standard drinks    Types: 2 Glasses of wine, 2 Cans of beer, 1 Standard drinks or equivalent per week    Comment: 2 times a week; none during pregnancy   Drug use: No    Allergies:   Allergies  Allergen Reactions   Sulfa Antibiotics Hives    Medications Prior to Admission  Medication Sig Dispense Refill Last Dose   ursodiol (ACTIGALL) 300 MG capsule Take 300 mg by mouth 2 (two) times daily.   04/25/2021   erythromycin ophthalmic ointment SMARTSIG:1 CENTIMETER(S) In Eye(s) 3 Times Daily      Prenatal Vit-Fe Fumarate-FA (PRENATAL MULTIVITAMIN) TABS tablet Take 1 tablet by mouth daily at 12 noon.      Results for orders placed or performed during the hospital encounter of 04/25/21 (from the past 48 hour(s))  Urinalysis, Routine w reflex microscopic Urine, Clean Catch     Status: None   Collection Time: 04/25/21  6:14 PM  Result Value Ref Range   Color, Urine YELLOW YELLOW   APPearance CLEAR CLEAR   Specific Gravity, Urine 1.010 1.005 - 1.030   pH 6.5 5.0 - 8.0   Glucose, UA NEGATIVE NEGATIVE mg/dL   Hgb urine dipstick NEGATIVE NEGATIVE  Bilirubin Urine NEGATIVE NEGATIVE   Ketones, ur NEGATIVE NEGATIVE mg/dL   Protein, ur NEGATIVE NEGATIVE mg/dL   Nitrite NEGATIVE NEGATIVE   Leukocytes,Ua NEGATIVE NEGATIVE    Comment: Microscopic not done on urines with negative protein, blood, leukocytes, nitrite, or glucose < 500 mg/dL. Performed at [facility], [address], [place], [place] [address]     Review of Systems  Genitourinary:  Negative for dysuria, frequency, urgency, vaginal bleeding and vaginal discharge.  Physical Exam   Blood pressure 128/79, pulse 97, temperature 98.3 F (36.8 C), resp. rate 20, last menstrual period 08/28/2020, SpO2 98 %, unknown if currently breastfeeding.  Physical Exam Constitutional:      General: She is not in acute distress.    Appearance: She is well-developed. She is not ill-appearing, toxic-appearing or diaphoretic.  Pulmonary:     Effort: No tachypnea or bradypnea.     Breath sounds: Normal breath sounds. No decreased air movement.     Comments:  Cough noted  Abdominal:     Tenderness: There is no abdominal  tenderness.  Genitourinary:    Comments: Cervix: 1, thick, posterior. Exam by Venia Carbon, NP Skin:    General: Skin is warm.  Neurological:     Mental Status: She is alert and oriented to person, place, and time.   Fetal Tracing: Baseline: 140 bpm Variability: Moderate  Accelerations: 15x15 Decelerations: None Toco: None  MAU Course  Procedures  MDM  Cervix thick and posterior. No active contractions noted. Patient rates pain 1/10. She feels comfortable going home.   Assessment and Plan   A:  1. Braxton Hick's contraction   2. [redacted] weeks gestation of pregnancy   3. Cough, unspecified type      P:  Discharge home Preterm labor precautions Return to MAU if symptoms worsen  Cheris Tweten, Harolyn Rutherford, NP 04/25/2021 8:30 PM

## 2021-04-25 NOTE — MAU Note (Signed)
Pt reports cramping that started today at 1400-1430. Pt reports feeling her baby move, but the moves are less strong.   Denies vaginal bleeding or LOF.

## 2021-04-28 ENCOUNTER — Telehealth (HOSPITAL_COMMUNITY): Payer: Self-pay | Admitting: *Deleted

## 2021-04-28 ENCOUNTER — Encounter (HOSPITAL_COMMUNITY): Payer: Self-pay | Admitting: *Deleted

## 2021-04-28 NOTE — Telephone Encounter (Signed)
Preadmission screen  

## 2021-04-29 LAB — OB RESULTS CONSOLE GBS: GBS: NEGATIVE

## 2021-05-04 ENCOUNTER — Encounter (HOSPITAL_COMMUNITY): Payer: Self-pay | Admitting: Obstetrics

## 2021-05-05 ENCOUNTER — Other Ambulatory Visit: Payer: Self-pay | Admitting: Obstetrics

## 2021-05-05 LAB — SARS CORONAVIRUS 2 (TAT 6-24 HRS): SARS Coronavirus 2: NEGATIVE

## 2021-05-06 ENCOUNTER — Other Ambulatory Visit: Payer: Self-pay | Admitting: Obstetrics

## 2021-05-06 NOTE — H&P (Signed)
Dawn Perez is a 36 y.o. [id number] at [redacted]w[redacted]d presenting for IOL for cholestasis of pregnany. Pt notes rare contractions. Good fetal movement, No vaginal bleeding, not leaking fluid. Intermittent HA, no RUQ pain, continues with total body itcing despite ursodiol.  ? ?Pt admitted at [facility], notes intermittent sleep overnight, itching stable, no HA. Starting to feel cramping, rates 5/10 ? ?PNCare at [employer] since 1st trimester. ?- Dated by early u/s ?- h/o cholestasis in prior pregnancy. On baby ASA this preg. Development of cholestasis at 33 wks this pregnancy with rapid development on itching, only minimal relief from ursodiol.  ?Bile acids with rapid increase ?1/20: 5.9 ?2/13: 11.6 ?2/16: 32 ?2/21: 100.2 ?2/wk fetal testing reactive but plan for early delivery ?- h/o PTD, SROM at 33 wks, delivery, SVD, 4 days later. Pt on Makena, to generic 17 P due to skin allergy, stopped at 35 wks. Nl 16 wk CL ? ? ?Prenatal Transfer Tool  ?Maternal Diabetes: No ?Genetic Screening: Normal ?Maternal Ultrasounds/Referrals: Normal ?Fetal Ultrasounds or other Referrals:  None ?Maternal Substance Abuse:  No ?Significant Maternal Medications:  Meds include: Other:  ?Significant Maternal Lab Results: Group B Strep negative ? ? ? ? ?OB History   ? ? Gravida  ?5  ? Para  ?1  ? Term  ?0  ? Preterm  ?1  ? AB  ?3  ? Living  ?1  ?  ? ? SAB  ?3  ? IAB  ?0  ? Ectopic  ?0  ? Multiple  ?0  ? Live Births  ?1  ?   ?  ?  ? ?Past Medical History:  ?Diagnosis Date  ? Boil of buttock 08/06/2014  ? Cholestasis during pregnancy in third trimester   ? Hyperlipidemia   ? ?Past Surgical History:  ?Procedure Laterality Date  ? WISDOM TOOTH EXTRACTION    ? ?Family History: family history includes Autoimmune disease (age of onset: 55) in her father; COPD in her paternal grandfather; Cancer in her maternal aunt and another family member; Heart disease in her maternal grandfather; Hyperlipidemia in her mother; Hypertension in her mother; Stroke in her  maternal grandfather and paternal grandmother. ?Social History:  reports that she has never smoked. She has never used smokeless tobacco. She reports that she does not currently use alcohol after a past usage of about 5.0 standard drinks per week. She reports that she does not use drugs. ? ?Review of Systems - Negative except itching, anxiety over cholestasis ? ? ?  ? ?Physical Exam:  ?Vitals:  ? 05/07/21 0530 05/07/21 0600 05/07/21 0630 05/07/21 0635  ?BP: 119/74 121/72 122/71   ?Pulse: 97 87 97   ?Resp:      ?Temp:    97.8 ?F (36.6 ?C)  ?TempSrc:    Oral  ?Weight:      ?Height:      ? ? ?Gen: well appearing, no distress ? ?Back: no CVAT ?Abd: gravid, NT, no RUQ pain ?LE: trace edema, equal bilaterally, non-tender ?Toco: irritibility q 2-3 min ?FH: baseline 130s, accelerations present, no deceleratons, 10 beat variability. AROM clear ? ?Prenatal labs: ?ABO, Rh: A/Positive/-- (09/09 0000) ?Antibody: Negative (09/09 0000) ?Rubella: Immune (09/09 0000) ?RPR: Nonreactive (09/09 0000)  ?HBsAg: Negative (09/09 0000)  ?HIV: Non-reactive (09/09 0000)  ?GBS:   neg ?1 hr Glucola 84  ?Genetic screening nl quad ?Anatomy US normal ? ? ?Assessment/Plan: 36 y.o. [id number] at 36'0 ?- cholestasis of preg. Move to delivery. No evidence PEC though plan labs on admit. Plan  pit 2x2, AROM when active ?- GBS neg  ? ?Lendon Colonel ?05/06/2021, 3:11 PM ? ?Lendon Colonel ?05/07/2021 6:44 AM  ? ? ?

## 2021-05-07 ENCOUNTER — Other Ambulatory Visit: Payer: Self-pay

## 2021-05-07 ENCOUNTER — Inpatient Hospital Stay (HOSPITAL_COMMUNITY)
Admission: AD | Admit: 2021-05-07 | Discharge: 2021-05-09 | DRG: 805 | Disposition: A | Discharge status: Home / Self Care | Payer: 59 | Attending: Obstetrics | Admitting: Obstetrics

## 2021-05-07 ENCOUNTER — Inpatient Hospital Stay (HOSPITAL_COMMUNITY): Payer: 59

## 2021-05-07 ENCOUNTER — Encounter (HOSPITAL_COMMUNITY): Payer: Self-pay | Admitting: Obstetrics

## 2021-05-07 DIAGNOSIS — K831 Obstruction of bile duct: Secondary | ICD-10-CM | POA: Diagnosis present

## 2021-05-07 DIAGNOSIS — Z3A36 36 weeks gestation of pregnancy: Secondary | ICD-10-CM

## 2021-05-07 DIAGNOSIS — O2662 Liver and biliary tract disorders in childbirth: Principal | ICD-10-CM | POA: Diagnosis present

## 2021-05-07 DIAGNOSIS — O26643 Intrahepatic cholestasis of pregnancy, third trimester: Secondary | ICD-10-CM

## 2021-05-07 DIAGNOSIS — Z3490 Encounter for supervision of normal pregnancy, unspecified, unspecified trimester: Secondary | ICD-10-CM | POA: Diagnosis present

## 2021-05-07 DIAGNOSIS — O26613 Liver and biliary tract disorders in pregnancy, third trimester: Secondary | ICD-10-CM

## 2021-05-07 HISTORY — DX: Liver and biliary tract disorders in pregnancy, third trimester: O26.613

## 2021-05-07 HISTORY — DX: Obstruction of bile duct: K83.1

## 2021-05-07 HISTORY — DX: Intrahepatic cholestasis of pregnancy, third trimester: O26.643

## 2021-05-07 LAB — CBC
HCT: 33.8 % — ABNORMAL LOW (ref 36.0–46.0)
Hemoglobin: 11.4 g/dL — ABNORMAL LOW (ref 12.0–15.0)
MCH: 31.1 pg (ref 26.0–34.0)
MCHC: 33.7 g/dL (ref 30.0–36.0)
MCV: 92.3 fL (ref 80.0–100.0)
Platelets: 259 10*3/uL (ref 150–400)
RBC: 3.66 MIL/uL — ABNORMAL LOW (ref 3.87–5.11)
RDW: 12.5 % (ref 11.5–15.5)
WBC: 13.2 10*3/uL — ABNORMAL HIGH (ref 4.0–10.5)
nRBC: 0 % (ref 0.0–0.2)

## 2021-05-07 LAB — TYPE AND SCREEN
ABO/RH(D): A POS
Antibody Screen: NEGATIVE

## 2021-05-07 LAB — RPR: RPR Ser Ql: NONREACTIVE

## 2021-05-07 MED ORDER — TETANUS-DIPHTH-ACELL PERTUSSIS 5-2.5-18.5 LF-MCG/0.5 IM SUSY: 0.5000 mL | PREFILLED_SYRINGE | Freq: Once | INTRAMUSCULAR | Status: DC

## 2021-05-07 MED ORDER — SENNOSIDES-DOCUSATE SODIUM 8.6-50 MG PO TABS: 2.0000 | ORAL_TABLET | ORAL | Status: DC

## 2021-05-07 MED ORDER — DIPHENHYDRAMINE HCL 25 MG PO CAPS: 25.0000 mg | ORAL_CAPSULE | Freq: Four times a day (QID) | ORAL | Status: DC | PRN

## 2021-05-07 MED ORDER — EPHEDRINE 5 MG/ML INJ: 10.0000 mg | INTRAVENOUS | Status: DC | PRN

## 2021-05-07 MED ORDER — ZOLPIDEM TARTRATE 5 MG PO TABS: 5.0000 mg | ORAL_TABLET | Freq: Every evening | ORAL | Status: DC | PRN

## 2021-05-07 MED ORDER — URSODIOL 300 MG PO CAPS
300.0000 mg | ORAL_CAPSULE | Freq: Two times a day (BID) | ORAL | Status: DC
  Administered 2021-05-07: 300 mg via ORAL
  Filled 2021-05-07 (×6): qty 1

## 2021-05-07 MED ORDER — FENTANYL-BUPIVACAINE-NACL 0.5-0.125-0.9 MG/250ML-% EP SOLN
EPIDURAL | Status: AC
  Filled 2021-05-07: qty 250

## 2021-05-07 MED ORDER — SIMETHICONE 80 MG PO CHEW: 80.0000 mg | CHEWABLE_TABLET | ORAL | Status: DC | PRN

## 2021-05-07 MED ORDER — FENTANYL CITRATE (PF) 100 MCG/2ML IJ SOLN
100.0000 ug | INTRAMUSCULAR | Status: DC | PRN
  Administered 2021-05-07: 100 ug via INTRAVENOUS
  Filled 2021-05-07: qty 2

## 2021-05-07 MED ORDER — FENTANYL-BUPIVACAINE-NACL 0.5-0.125-0.9 MG/250ML-% EP SOLN: 12.0000 mL/h | EPIDURAL | Status: DC | PRN

## 2021-05-07 MED ORDER — OXYTOCIN BOLUS FROM INFUSION
333.0000 mL | Freq: Once | INTRAVENOUS | Status: AC
  Administered 2021-05-07: 333 mL via INTRAVENOUS

## 2021-05-07 MED ORDER — PHENYLEPHRINE 40 MCG/ML (10ML) SYRINGE FOR IV PUSH (FOR BLOOD PRESSURE SUPPORT)
PREFILLED_SYRINGE | INTRAVENOUS | Status: AC
  Filled 2021-05-07: qty 10

## 2021-05-07 MED ORDER — WITCH HAZEL-GLYCERIN EX PADS: 1.0000 "application " | MEDICATED_PAD | CUTANEOUS | Status: DC | PRN

## 2021-05-07 MED ORDER — ONDANSETRON HCL 4 MG/2ML IJ SOLN: 4.0000 mg | Freq: Four times a day (QID) | INTRAMUSCULAR | Status: DC | PRN

## 2021-05-07 MED ORDER — ONDANSETRON HCL 4 MG/2ML IJ SOLN: 4.0000 mg | INTRAMUSCULAR | Status: DC | PRN

## 2021-05-07 MED ORDER — DIPHENHYDRAMINE HCL 50 MG/ML IJ SOLN: 12.5000 mg | INTRAMUSCULAR | Status: DC | PRN

## 2021-05-07 MED ORDER — LACTATED RINGERS IV SOLN: 500.0000 mL | INTRAVENOUS | Status: DC | PRN

## 2021-05-07 MED ORDER — LACTATED RINGERS IV SOLN
INTRAVENOUS | Status: DC
  Administered 2021-05-07: 125 mL/h via INTRAVENOUS

## 2021-05-07 MED ORDER — BENZOCAINE-MENTHOL 20-0.5 % EX AERO: 1.0000 "application " | INHALATION_SPRAY | CUTANEOUS | Status: DC | PRN

## 2021-05-07 MED ORDER — PRENATAL MULTIVITAMIN CH
1.0000 | ORAL_TABLET | Freq: Every day | ORAL | Status: DC
  Administered 2021-05-08 – 2021-05-09 (×2): 1 via ORAL
  Filled 2021-05-07 (×2): qty 1

## 2021-05-07 MED ORDER — OXYCODONE HCL 5 MG PO TABS: 10.0000 mg | ORAL_TABLET | ORAL | Status: DC | PRN

## 2021-05-07 MED ORDER — DIBUCAINE (PERIANAL) 1 % EX OINT: 1.0000 "application " | TOPICAL_OINTMENT | CUTANEOUS | Status: DC | PRN

## 2021-05-07 MED ORDER — ONDANSETRON HCL 4 MG PO TABS: 4.0000 mg | ORAL_TABLET | ORAL | Status: DC | PRN

## 2021-05-07 MED ORDER — OXYTOCIN-SODIUM CHLORIDE 30-0.9 UT/500ML-% IV SOLN
1.0000 m[IU]/min | INTRAVENOUS | Status: DC
  Administered 2021-05-07: 2 m[IU]/min via INTRAVENOUS
  Filled 2021-05-07: qty 500

## 2021-05-07 MED ORDER — OXYCODONE HCL 5 MG PO TABS: 5.0000 mg | ORAL_TABLET | ORAL | Status: DC | PRN

## 2021-05-07 MED ORDER — ACETAMINOPHEN 325 MG PO TABS: 650.0000 mg | ORAL_TABLET | ORAL | Status: DC | PRN

## 2021-05-07 MED ORDER — TERBUTALINE SULFATE 1 MG/ML IJ SOLN: 0.2500 mg | Freq: Once | INTRAMUSCULAR | Status: DC | PRN

## 2021-05-07 MED ORDER — OXYTOCIN-SODIUM CHLORIDE 30-0.9 UT/500ML-% IV SOLN: 2.5000 [IU]/h | INTRAVENOUS | Status: DC

## 2021-05-07 MED ORDER — LIDOCAINE HCL (PF) 1 % IJ SOLN: 30.0000 mL | INTRAMUSCULAR | Status: DC | PRN

## 2021-05-07 MED ORDER — SOD CITRATE-CITRIC ACID 500-334 MG/5ML PO SOLN: 30.0000 mL | ORAL | Status: DC | PRN

## 2021-05-07 MED ORDER — LACTATED RINGERS IV SOLN
500.0000 mL | Freq: Once | INTRAVENOUS | Status: AC
  Administered 2021-05-07: 500 mL via INTRAVENOUS

## 2021-05-07 MED ORDER — IBUPROFEN 600 MG PO TABS
600.0000 mg | ORAL_TABLET | Freq: Four times a day (QID) | ORAL | Status: DC
  Administered 2021-05-07 – 2021-05-09 (×7): 600 mg via ORAL
  Filled 2021-05-07 (×8): qty 1

## 2021-05-07 MED ORDER — COCONUT OIL OIL
1.0000 "application " | TOPICAL_OIL | Status: DC | PRN
  Administered 2021-05-07: 1 via TOPICAL

## 2021-05-07 MED ORDER — PHENYLEPHRINE 40 MCG/ML (10ML) SYRINGE FOR IV PUSH (FOR BLOOD PRESSURE SUPPORT): 80.0000 ug | PREFILLED_SYRINGE | INTRAVENOUS | Status: DC | PRN

## 2021-05-07 NOTE — Lactation Note (Addendum)
This note was copied from a baby's chart. ?Lactation Consultation Note ? ?Patient Name: Dawn Perez ?Today's Date: 05/07/2021 ?Reason for consult: Initial assessment;Late-preterm 34-36.6wks ?Age:36 hours ?P2, LPTI  ?Dad changed a void and stool diaper while LC was in the room. ?Mom is experienced at breastfeeding, per mom 1st child was in NICU born [redacted] weeks gestation, she started latching first child at the breast at [redacted] weeks gestation and BF him for 18 months. ?Mom's feeding choice is breastfeeding and supplementing infant with donor breast milk. ?LC entered the room, dad was supplementing infant with donor breast milk ,infant had taken 2 mls from the bottle. ?Mom was open to latching infant at the breast, mom had made attempts but infant was not sustaining latch. ?Mom latched infant on her left breast using the football hold position, infant was supplemented at breast with 3 mls of donor breast milk and BF for 6 minutes. ?Sutton notice infant will slip to mom's tip and LC discussed with mom re-latch infant if she is feels pain and if she notice infant latch is shallow. ?Infant was given additional 2 mls of donor milk using curve tip syringe, infant does have coordinated sucking pattern. ?Per mom, she pumped once and expressed 3 mls of colostrum, mom was using the DEBP as LC left the room. ?Mom's plan: ?1- Mom will follow LPTI feeding policy. ?2-Mom will supplement infant with her EBM first before offering donor breast milk. ?3- Mom will continue to pump every 3 hours for 15 minutes on initial setting. ?4- Mom knows to call RN/LC if she need further latch assistance.  ? ?Maternal Data ?Has patient been taught Hand Expression?: No ?Does the patient have breastfeeding experience prior to this delivery?: Yes ?How long did the patient breastfeed?: Per mom, her son was in NICU born 55 weeks, per mom she latched son at 6 weeks and BF for 18 months. ? ?Feeding ?Mother's Current Feeding Choice: Breast Milk and Donor  Milk ? ?LATCH Score ?Latch: Grasps breast easily, tongue down, lips flanged, rhythmical sucking. ? ?Audible Swallowing: A few with stimulation ? ?Type of Nipple: Everted at rest and after stimulation ? ?Comfort (Breast/Nipple): Soft / non-tender ? ?Hold (Positioning): Assistance needed to correctly position infant at breast and maintain latch. ? ?LATCH Score: 8 ? ? ?Lactation Tools Discussed/Used ?Tools: Pump ?Breast pump type: Double-Electric Breast Pump ?Pump Education: Setup, frequency, and cleaning;Milk Storage ?Reason for Pumping: Initally infant was not latching at the breast, mom has made a few attempts, LPTI and mom is supplementing with donor breast milk. ?Pumping frequency: Mom will pump every 3 hours for 15 minutes on inital setting. ? ?Interventions ?Interventions: Breast feeding basics reviewed;Assisted with latch;Skin to skin;Breast compression;Adjust position;Support pillows;Position options;DEBP;Education ? ?Discharge ?  ? ?Consult Status ?Consult Status: Follow-up ?Date: 05/08/21 ?Follow-up type: In-patient ? ? ? ?Vicente Serene ?05/07/2021, 7:01 PM ? ? ? ?

## 2021-05-07 NOTE — Lactation Note (Signed)
This note was copied from a baby's chart. ?Lactation Consultation Note ? ?Patient Name: Dawn Perez ?Today's Date: 05/07/2021 ?  ?Age:36 hours ? ?P2. "[name]" had difficulty latching to the R breast, but latched with relative ease to the L breast. Parents are aware that most 36 week infants need to be supplemented with EBM, DBM for the first few weeks. I also let Mom know that we would start using the DEBP.  ? ?First child was in the NICU for 17 days (born at 22 weeks). Pacifier use not recommended at this time.  ? ?Lactation to f/u on postpartum floor.  ?  ?Larkin Ina ?05/07/2021, 10:08 AM ? ? ? ?

## 2021-05-07 NOTE — Lactation Note (Signed)
This note was copied from a baby's chart. ?Lactation Consultation Note ? ?Patient Name: Dawn Perez ?Today's Date: 05/07/2021 ?  ?Age:36 hours ?P2, LPTI  ?LC entered room, mom was having lunch and would like LC return later today. [facility] written her name on the white board and mom will call LC when ready for infant's next feeding. ?Maternal Data ?  ? ?Feeding ?  ? ?LATCH Score ?Latch: Too sleepy or reluctant, no latch achieved, no sucking elicited. ? ?  ? ?  ? ?  ? ?  ? ?  ? ? ?Lactation Tools Discussed/Used ?  ? ?Interventions ?  ? ?Discharge ?  ? ?Consult Status ?  ? ? ? ?Danelle Earthly ?05/07/2021, 3:46 PM ? ? ? ?

## 2021-05-08 LAB — COMPREHENSIVE METABOLIC PANEL
ALT: 22 U/L (ref 0–44)
AST: 21 U/L (ref 15–41)
Albumin: 2.6 g/dL — ABNORMAL LOW (ref 3.5–5.0)
Alkaline Phosphatase: 146 U/L — ABNORMAL HIGH (ref 38–126)
Anion gap: 8 (ref 5–15)
BUN: 10 mg/dL (ref 6–20)
CO2: 20 mmol/L — ABNORMAL LOW (ref 22–32)
Calcium: 9.5 mg/dL (ref 8.9–10.3)
Chloride: 105 mmol/L (ref 98–111)
Creatinine, Ser: 0.68 mg/dL (ref 0.44–1.00)
GFR, Estimated: >60 mL/min (ref >60)
Glucose, Bld: 98 mg/dL (ref 70–99)
Potassium: 4.1 mmol/L (ref 3.5–5.1)
Sodium: 133 mmol/L — ABNORMAL LOW (ref 135–145)
Total Bilirubin: 0.3 mg/dL (ref 0.3–1.2)
Total Protein: 5.9 g/dL — ABNORMAL LOW (ref 6.5–8.1)

## 2021-05-08 LAB — CBC
HCT: 34 % — ABNORMAL LOW (ref 36.0–46.0)
Hemoglobin: 11.6 g/dL — ABNORMAL LOW (ref 12.0–15.0)
MCH: 31.4 pg (ref 26.0–34.0)
MCHC: 34.1 g/dL (ref 30.0–36.0)
MCV: 92.1 fL (ref 80.0–100.0)
Platelets: 251 10*3/uL (ref 150–400)
RBC: 3.69 MIL/uL — ABNORMAL LOW (ref 3.87–5.11)
RDW: 12.7 % (ref 11.5–15.5)
WBC: 15.9 10*3/uL — ABNORMAL HIGH (ref 4.0–10.5)
nRBC: 0 % (ref 0.0–0.2)

## 2021-05-08 NOTE — Progress Notes (Signed)
Post Partum Day 1  SVD, IOL at 36 wks for severe cholestasis of pregnancy [id number]  ?Subjective: ?no complaints, up ad lib, voiding, tolerating PO, and + flatus ? ?Objective: ?Blood pressure (!) 99/53, pulse 75, temperature 97.8 ?F (36.6 ?C), temperature source Oral, resp. rate 18, height 5\' 6"  (1.676 m), weight 93 kg, last menstrual period 08/28/2020, SpO2 98 %, unknown if currently breastfeeding. ? ?Physical Exam:  ?General: alert, cooperative, and appears stated age ?Lochia: appropriate ?Uterine Fundus: firm ?Incision: healing well ?DVT Evaluation: Negative Homan's sign. ? ?CBC Latest Ref Rng & Units 05/08/2021 05/07/2021   ?WBC 4.0 - 10.5 K/uL 15.9(H) 13.2(H)   ?Hemoglobin 12.0 - 15.0 g/dL 11.6(L) 11.4(L)   ?Hematocrit 36.0 - 46.0 % 34.0(L) 33.8(L)   ?Platelets 150 - 400 K/uL 251 259   ?  ?CMP  ?   ?Component Value Date/Time  ? NA 133 (L) 05/08/2021 0503  ? K 4.1 05/08/2021 0503  ? CL 105 05/08/2021 0503  ? CO2 20 (L) 05/08/2021 0503  ? GLUCOSE 98 05/08/2021 0503  ? BUN 10 05/08/2021 0503  ? CREATININE 0.68 05/08/2021 0503  ? CREATININE 0.98 07/09/2013 0923  ? CALCIUM 9.5 05/08/2021 0503  ? PROT 5.9 (L) 05/08/2021 0503  ? ALBUMIN 2.6 (L) 05/08/2021 0503  ? AST 21 05/08/2021 0503  ? ALT 22 05/08/2021 0503  ? ALKPHOS 146 (H) 05/08/2021 0503  ? BILITOT 0.3 05/08/2021 0503  ? GFRNONAA >60 05/08/2021 0503  ? GFRAA >60 10/21/2017 2015  ?  ?Rh pos ?Rub Immune ? ?Assessment/Plan: ?PPD #1  SVD. At 36 wks- IOL for ICP  ?1) PP -routine care and stable ?2) preterm baby- good weight and stable , rooming in  ?3) ICP- nl PIH labs and BP, no concerns now  ?Anticipate DC tomorrow when baby will be ready  ? ? LOS: 1 day  ? ?10/23/2017 Braylyn Kalter ?05/08/2021, 9:45 AM  ? ? ?

## 2021-05-08 NOTE — Lactation Note (Signed)
This note was copied from a baby's chart. ?Lactation Consultation Note ? ?Patient Name: Dawn Perez ?Today's Date: 05/08/2021 ?Reason for consult: Follow-up assessment;Late-preterm 34-36.6wks (age 36) ?Age:36 years ? ?Follow up visit to [place] infant. Mother is getting ready to bottlefeed breast milk. She is collecting ~22 mL per pumping session. LC brought extra slow flow nipples (Nfant white) and slow flow (yellow) since mother reports some emesis after last feeding. Mother is experiencing some nipple discomfort. LC provided comfort gels. Mother has coconut oil, encouraged to use separately. ?No questions.  ?Mother is comfortable with current feeding plan.  ? ?Feeding ?Mother's Current Feeding Choice: Breast Milk and Donor Milk ?Nipple Type: Nfant Standard Flow (white) ? ?Lactation Tools Discussed/Used ?Tools: Pump ?Breast pump type: Double-Electric Breast Pump;Manual ?Reason for Pumping: stimulation and supplementation ?Pumping frequency: every 3h ?Pumped volume: 22 mL ? ?Interventions ?Interventions: Breast feeding basics reviewed;DEBP;Hand pump;Coconut oil;Expressed milk;Education;Comfort gels ? ?Discharge ?Discharge Education: Engorgement and breast care ? ?Consult Status ?Consult Status: Follow-up ?Date: 05/09/21 ?Follow-up type: In-patient ? ? ? ?Osmany Azer A Higuera Ancidey ?05/08/2021, 7:30 PM ? ? ? ?

## 2021-05-09 MED ORDER — ACETAMINOPHEN 325 MG PO TABS: 650.0000 mg | ORAL_TABLET | ORAL | Status: AC | PRN

## 2021-05-09 MED ORDER — IBUPROFEN 600 MG PO TABS: 600.0000 mg | ORAL_TABLET | Freq: Four times a day (QID) | ORAL | 0 refills | Status: AC

## 2021-05-09 NOTE — Lactation Note (Signed)
This note was copied from a baby's chart. ?Lactation Consultation Note ? ?Patient Name: Girl Prabhjot Tuchscherer ?Today's Date: 05/09/2021 ?Reason for consult: Follow-up assessment;Late-preterm 34-36.6wks;Infant weight loss;Mother's request;Nipple pain/trauma ?Age:36 hours ? ?LC in to visit with P2 Mom of LPTI on day of probable discharge.  Baby is at 8% weight loss, down 85 gms from yesterday.  ? ?Baby has been supplemented with EBM and donor milk most recently >20 ml per feeding.  Mom's milk is coming to volume and she expressed 30 ml at last session.  Parents are aware that baby should be consuming 20-30 ml each feeding now that baby is over 59 hrs old. ? ?Mom concerned that baby hasn't been breastfeeding well.  Mom has bilateral nipple trauma.  Baby has a significant upper labial frenulum noted to gum line.  LC tried to assess oral structure, but baby pursing her mouth.  Did observe tongue tip without an anterior lingual frenulum.   ? ?LC recommends OP lactation F/U if discharged today.  Message sent to S. Hice RN IBCLC in clinic. ? ?Mom has a Spectra DEBP at home.  Encouraged consistent pumping and lots of STS with baby.  ? ?Encouraged Mom to ask for assistance with positioning and latching to the breast at next feeding if desired.   ? ?Reassured Mom that baby was 36w gestation and it was normal for baby to be sleepy at the breast and/or disorganized with suck.  ?  ?Lactation Tools Discussed/Used ?Tools: Pump;Flanges;Nipple [place] Fuel;Bottle ?Breast pump type: Double-Electric Breast Pump;Manual ?Pump Education: Setup, frequency, and cleaning;Milk Storage ?Reason for Pumping: Support milk supply/LPTI ?Pumping frequency: Every 3 hrs ?Pumped volume: 30 mL ? ?Interventions ?Interventions: Breast feeding basics reviewed;Skin to skin;Breast massage;Hand express;Expressed milk;Coconut oil;Comfort gels;Hand pump;DEBP;Education;Pace feeding ? ?Discharge ?Discharge Education: Engorgement and breast care;Warning signs for feeding  baby;Outpatient recommendation;Outpatient Epic message sent ?Pump: Personal (Spectra 2) ? ?Consult Status ?Consult Status: Follow-up ?Date: 05/10/21 ?Follow-up type: In-patient ? ? ? ?Tilda Burrow E ?05/09/2021, 10:06 AM ? ? ? ?

## 2021-05-09 NOTE — Discharge Summary (Signed)
? ?  Postpartum Discharge Summary ?   ?Patient Name: Dawn Perez ?DOB: 29-Mar-1985 ?MRN: [id number] ? ?Date of admission: 05/07/2021 ?Delivery date:05/07/2021  ?Delivering provider: Aloha Gell  ?Date of discharge: 05/09/2021 ? ?Admitting diagnosis: Cholestasis of pregnancy- rapidly worsening bile acids. Admission for induction  ?Intrauterine pregnancy: [redacted]w[redacted]d    ?   ?Discharge diagnosis: Preterm Pregnancy Delivered at 36 weeeks.                                          ?Post partum procedures: NOne ?Augmentation: Pitocin ?Complications: None ? ?Hospital course: Induction of Labor With Vaginal Delivery   ?36y.o. yo [id number] 323w0das admitted to the hospital 05/07/2021 for induction of labor.  Indication for induction:  severe cholestasis of pregnancy .  Patient had an uncomplicated labor course as follows: ?Membrane Rupture Time/Date: 6:35 AM ,05/07/2021   ?Delivery Method:Vaginal, Spontaneous  ?Episiotomy: None  ?Lacerations:  None  ?Details of delivery can be found in separate delivery note.  Patient had a routine postpartum course. Patient is discharged home 05/09/21. ? ?Newborn Data: ?Birth date:05/07/2021  ?Birth time:9:25 AM  ?Gender:Female  ?Living status:Living  ?Apgars:9 ,9  ?Weight:2995 g  ? ?Magnesium Sulfate received: No ?BMZ received: Yes ?Rhophylac:N/A ?MMR:N/A ?T-DaP:Given prenatally ?Flu: Yes ?Transfusion:No ? ?Physical exam  ?Vitals:  ? 05/08/21 0033 05/08/21 0513 05/08/21 2158 05/09/21 0725  ?BP: 108/81 (!) 99/53 115/75 123/80  ?Pulse: 76 75 (!) 54 75  ?Resp: 18 18 18    ?Temp: 98.1 ?F (36.7 ?C) 97.8 ?F (36.6 ?C) 97.7 ?F (36.5 ?C)   ?TempSrc: Oral Oral Oral   ?SpO2: 98%     ?Weight:      ?Height:      ? ?General: alert, cooperative, and no distress ?Lochia: appropriate ?Uterine Fundus: firm ?Incision: Healing well with no significant drainage ?DVT Evaluation: No evidence of DVT seen on physical exam. ?Negative Homan's sign. ?Labs: ?Lab Results  ?Component Value Date  ? WBC 15.9 (H) 05/08/2021  ? HGB  11.6 (L) 05/08/2021  ? HCT 34.0 (L) 05/08/2021  ? MCV 92.1 05/08/2021  ? PLT 251 05/08/2021  ? ?CMP Latest Ref Rng & Units 05/08/2021  ?Glucose 70 - 99 mg/dL 98  ?BUN 6 - 20 mg/dL 10  ?Creatinine 0.44 - 1.00 mg/dL 0.68  ?Sodium 135 - 145 mmol/L 133(L)  ?Potassium 3.5 - 5.1 mmol/L 4.1  ?Chloride 98 - 111 mmol/L 105  ?CO2 22 - 32 mmol/L 20(L)  ?Calcium 8.9 - 10.3 mg/dL 9.5  ?Total Protein 6.5 - 8.1 g/dL 5.9(L)  ?Total Bilirubin 0.3 - 1.2 mg/dL 0.3  ?Alkaline Phos 38 - 126 U/L 146(H)  ?AST 15 - 41 U/L 21  ?ALT 0 - 44 U/L 22  ? ?Edinburgh Score: ?Edinburgh Postnatal Depression Scale Screening Tool 05/08/2021  ?I have been able to laugh and see the funny side of things. 0  ?I have looked forward with enjoyment to things. 0  ?I have blamed myself unnecessarily when things went wrong. 2  ?I have been anxious or worried for no good reason. 1  ?I have felt scared or panicky for no good reason. 1  ?Things have been getting on top of me. 1  ?I have been so unhappy that I have had difficulty sleeping. 1  ?I have felt sad or miserable. 0  ?I have been so unhappy that I have been crying. 0  ?The  thought of harming myself has occurred to me. 0  ?Edinburgh Postnatal Depression Scale Total 6  ? ? ? ? ?After visit meds:  ?Allergies as of 05/09/2021   ? ?   Reactions  ? Sulfa Antibiotics Hives  ? ?  ? ?  ?Medication List  ?  ? ?STOP taking these medications   ? ?ursodiol 300 MG capsule ?Commonly known as: ACTIGALL ?  ? ?  ? ?TAKE these medications   ? ?acetaminophen 325 MG tablet ?Commonly known as: Tylenol ?Take 2 tablets (650 mg total) by mouth every 4 (four) hours as needed (for pain scale < 4). ?  ?benzonatate 100 MG capsule ?Commonly known as: Best boy ?Take 2 capsules (200 mg total) by mouth 3 (three) times daily as needed for cough. ?  ?erythromycin ophthalmic ointment ?SMARTSIG:1 CENTIMETER(S) In Eye(s) 3 Times Daily ?  ?ibuprofen 600 MG tablet ?Commonly known as: ADVIL ?Take 1 tablet (600 mg total) by mouth every 6 (six)  hours. ?  ?prenatal multivitamin Tabs tablet ?Take 1 tablet by mouth daily at 12 noon. ?  ? ?  ? ? ? ?Discharge home in stable condition ?Infant Feeding: Breast ?Infant Disposition:home with mother ?Discharge instruction: per After Visit Summary and Postpartum booklet. ?Activity: Advance as tolerated. Pelvic rest for 6 weeks.  ?Diet: routine diet ?Anticipated Birth Control: Unsure ?Postpartum Appointment:1 week ?Future Appointments:Dr Fogleman 1 week is scheduled ?Follow up Visit: ? Follow-up Information   ? ? Aloha Gell, MD Follow up in 1 week(s).   ?Specialty: Obstetrics and Gynecology ?Contact information: ?[address] ?[place] [address] ?[phone] ? ? ?  ?  ? ?  ?  ? ?  ? ? ?Postpartum care and warning signs d/w pt  ?  ? ?05/09/2021 ?Elveria Royals, MD ? ? ?

## 2021-05-09 NOTE — Progress Notes (Signed)
Post Partum Day 2  SVD, IOL at 36 wks for severe cholestasis of pregnancy [id number]  ?Subjective: ?no complaints, up ad lib, voiding, tolerating PO, and + flatus ? ?Objective: ?Blood pressure 123/80, pulse 75, temperature 97.7 ?F (36.5 ?C), temperature source Oral, resp. rate 18, height 5\' 6"  (1.676 m), weight 93 kg, last menstrual period 08/28/2020, SpO2 98 %, unknown if currently breastfeeding. ? ?Physical Exam:  ?General: alert, cooperative, and appears stated age ?Lochia: appropriate ?Uterine Fundus: firm ?Incision: healing well ?DVT Evaluation: Negative Homan's sign. ? ?CBC Latest Ref Rng & Units 05/08/2021 05/07/2021   ?WBC 4.0 - 10.5 K/uL 15.9(H) 13.2(H)   ?Hemoglobin 12.0 - 15.0 g/dL 11.6(L) 11.4(L)   ?Hematocrit 36.0 - 46.0 % 34.0(L) 33.8(L)   ?Platelets 150 - 400 K/uL 251 259   ?  ?CMP wnl ?  ?Rh pos ?Rub Immune ? ?Assessment/Plan: ?PPD # 2  SVD. At 36 wks- IOL for ICP  ?1) PP -routine care and stable ?2) preterm baby- good weight and stable , rooming in  ?3) ICP- nl PIH labs and BP, no concerns now  ?Discharge home ?Has f/up w/ Dr 07/07/2021 in 1 wk  ?PP care and warning s/s d/w pt  ? ? LOS: 2 days  ? ?Dawn Perez ?05/09/2021, 8:58 AM  ? ? ?

## 2021-05-10 ENCOUNTER — Ambulatory Visit: Payer: Self-pay

## 2021-05-10 LAB — BIRTH TISSUE RECOVERY COLLECTION (PLACENTA DONATION)

## 2021-05-10 NOTE — Lactation Note (Signed)
This note was copied from a baby's chart. ?Lactation Consultation Note ? ?Patient Name: Dawn Perez ?Today's Date: 05/10/2021 ?Reason for consult: Follow-up assessment;Late-preterm 34-36.6wks ?Age:36 hours ? ? ?P2 mother whose infant is now 23 hours old.  This is a LPTI at 36+0 weeks with a CGA of 36+3 weeks.  Mother has been breast feeding and supplementing with donor breast milk until her supply increased.  She is currently using her EBM only for supplementation. ? ?Mother had no questions/concerns at this time.  She feels like baby is breast feeding better.  Discussed the frenulum issue that the previous LC noted.  Mother does not feel like she needs a follow up with OP LC at this time.  She has our OP phone numbers if a future visit is necessary.  Discussed supplementation volumes of 30+ mls now.  Mother will latch prior to giving supplementation.   ? ?Family ready for discharge.  RN to follow with discharge instructions.  Father present. ? ? ?Maternal Data ?  ? ?Feeding ?Mother's Current Feeding Choice: Breast Milk and Formula ? ?LATCH Score ?  ? ?  ? ?  ? ?  ? ?  ? ?  ? ? ?Lactation Tools Discussed/Used ?  ? ?Interventions ?Interventions: Breast feeding basics reviewed;Education ? ?Discharge ?Discharge Education: Engorgement and breast care ? ?Consult Status ?Consult Status: Complete ?Date: 05/10/21 ?Follow-up type: Call as needed ? ? ? ?Airyanna Dipalma R Loukas Antonson ?05/10/2021, 11:05 AM ? ? ? ?

## 2021-05-18 ENCOUNTER — Telehealth (HOSPITAL_COMMUNITY): Payer: Self-pay | Admitting: *Deleted

## 2021-05-18 NOTE — Telephone Encounter (Signed)
Mom reports feeling good. No concerns about herself at this time. EPDS=1 [facility] score=6) ?Mom reports baby is doing well. Feeding, peeing, and pooping without difficulty. Safe sleep reviewed. Mom reports no concerns about baby at present. ? ?Odis Hollingshead, RN 05-18-2021 at 2:58pm ?

## 2021-05-21 ENCOUNTER — Inpatient Hospital Stay (HOSPITAL_COMMUNITY): Admit: 2021-05-21 | Payer: 59 | Admitting: Obstetrics

## 2021-10-26 IMAGING — US US ABDOMEN COMPLETE
1 series · 14 of 25 positions shown · non-contrast
Comparison: None.

CLINICAL DATA: Persistent nausea.

EXAM:
ABDOMEN ULTRASOUND COMPLETE

[Series 1: us abdomen complete · 0.22mm/px · 14 of 84 slices shown]
[im 1/84]
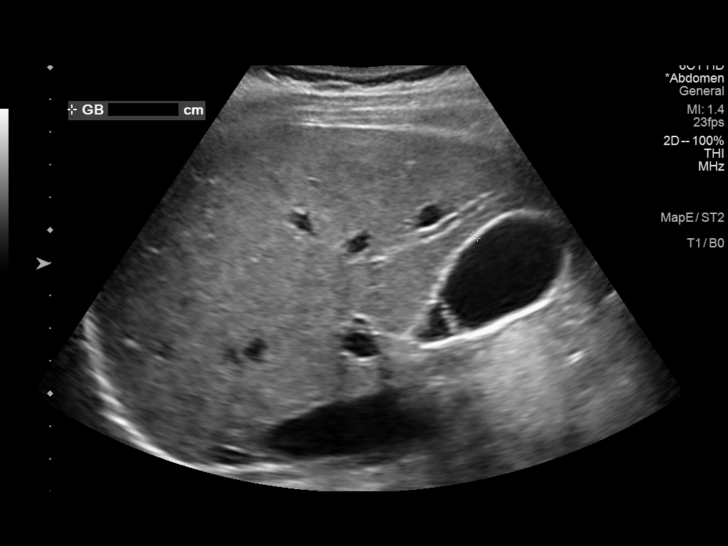
[im 7/84]
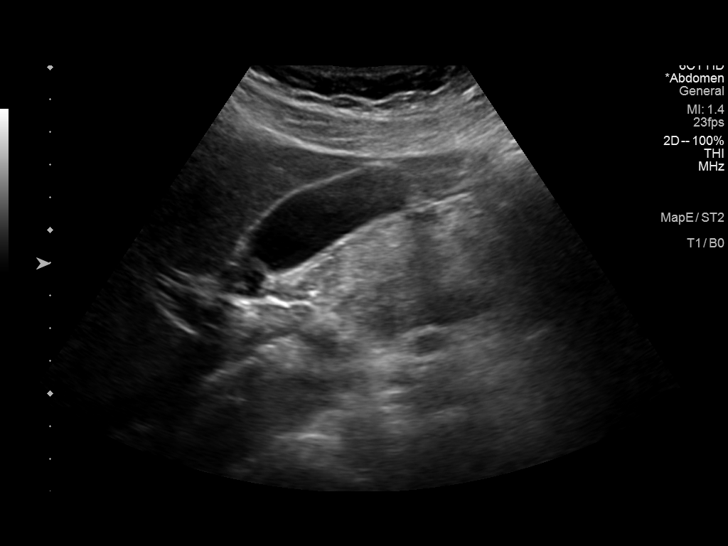
[im 14/84]
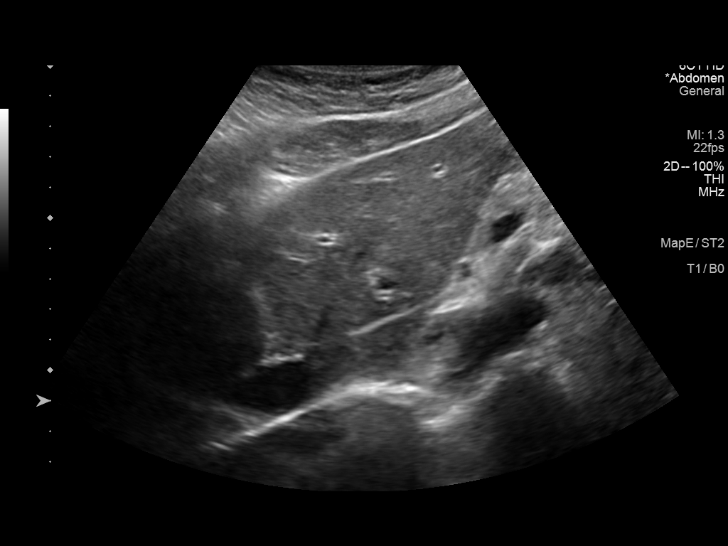
[im 21/84]
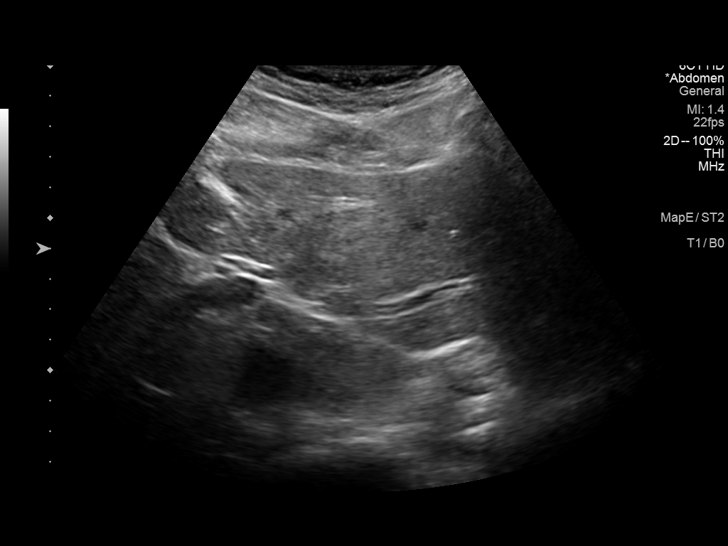
[im 28/84]
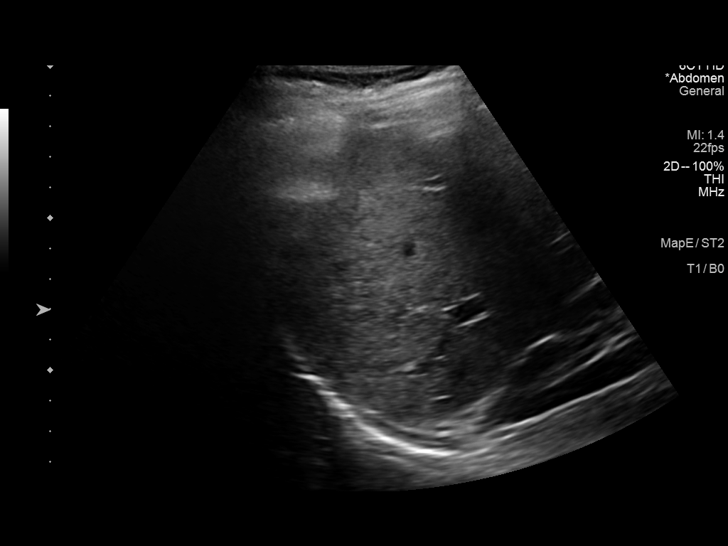
[im 32/84]
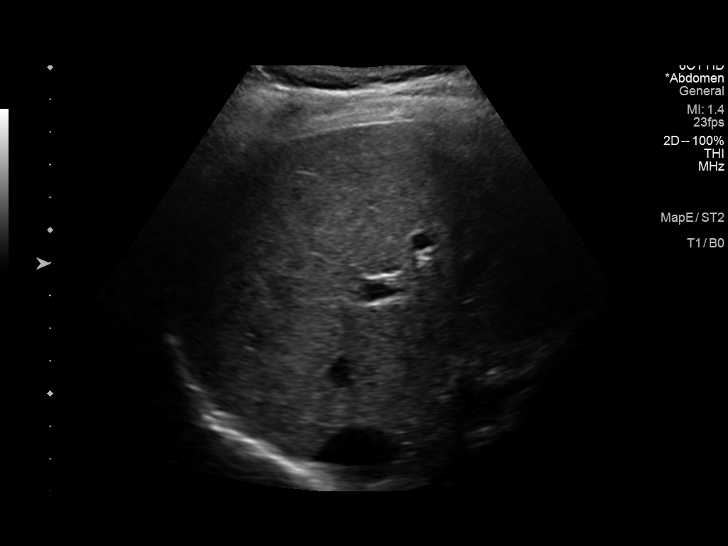
[im 39/84]
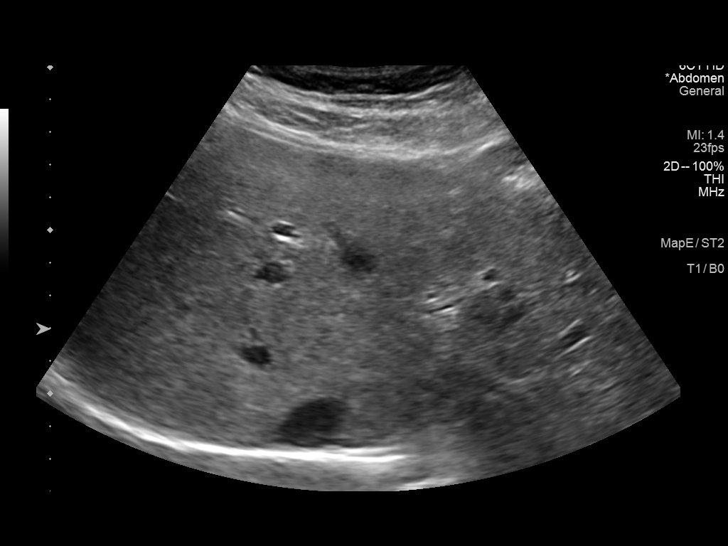
[im 45/84]
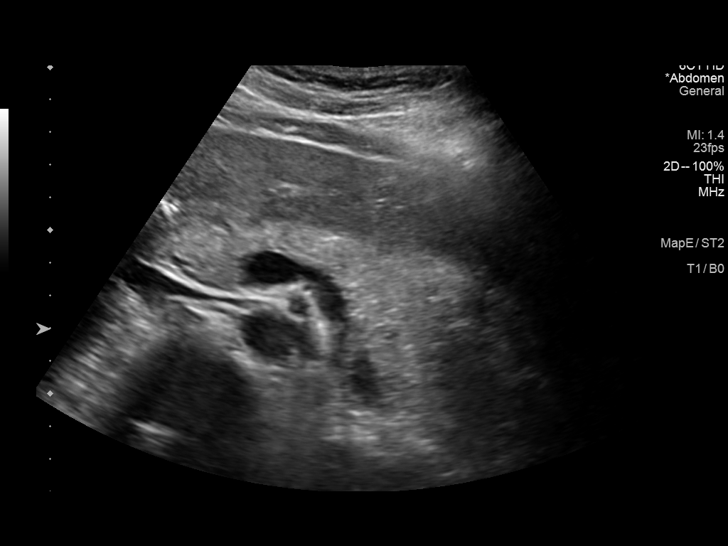
[im 52/84]
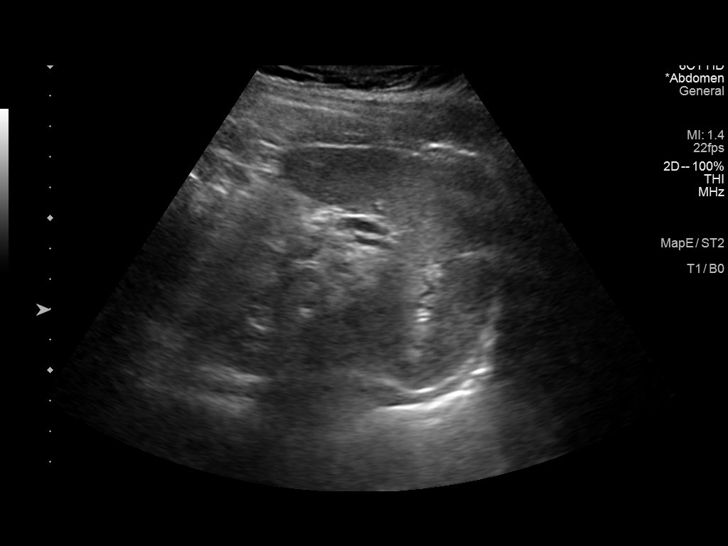
[im 56/84]
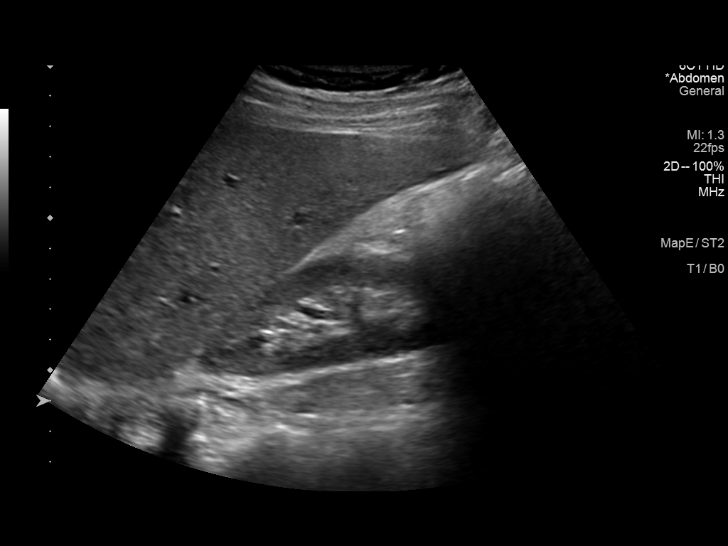
[im 63/84]
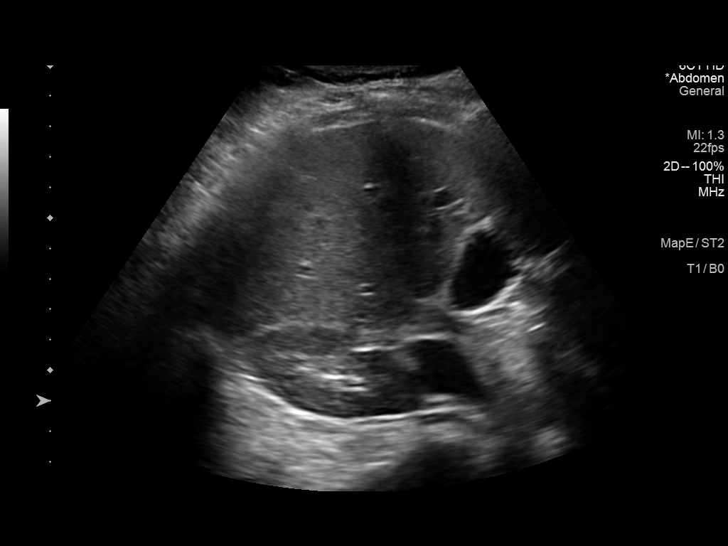
[im 70/84]
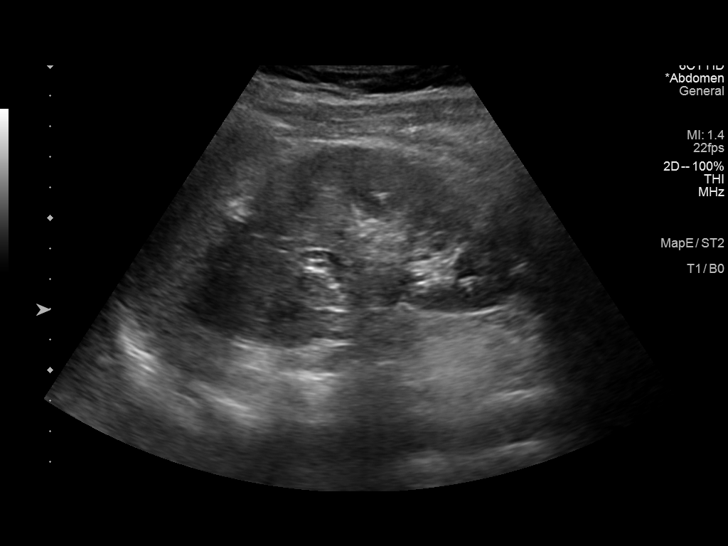
[im 77/84]
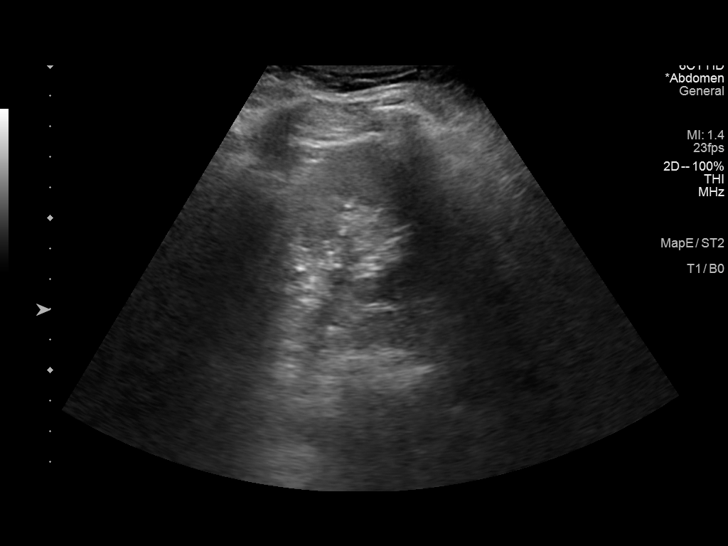
[im 84/84]
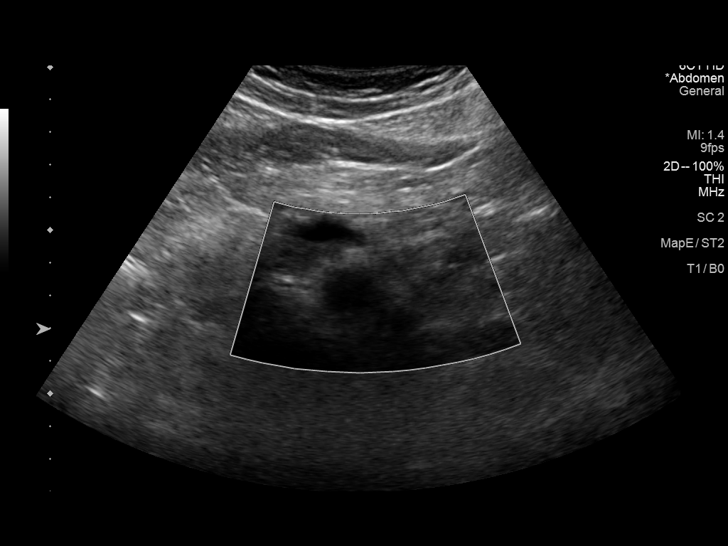

[14 of 25 positions shown; findings below may reference images not displayed]

FINDINGS: Gallbladder: No gallstones or wall thickening visualized. No
sonographic Murphy sign noted by sonographer.

Common bile duct: Diameter: 2 mm.

Liver: Liver appears normal by ultrasound. There may be very
slightly increased echogenicity of the liver parenchyma that may
reflect very mild steatosis. Portal vein is patent on color Doppler
imaging with normal direction of blood flow towards the liver.

IVC: No abnormality visualized.

Pancreas: Visualized portion unremarkable.

Spleen: Size and appearance within normal limits.

Right Kidney: Length: 10.9 cm. Echogenicity within normal limits. No
mass or hydronephrosis visualized.

Left Kidney: Length: 11.8 cm. Echogenicity within normal limits. No
mass or hydronephrosis visualized.

Abdominal aorta: No aneurysm visualized.

Other findings: No ascites.
IMPRESSION: Unremarkable abdominal ultrasound. Suggestion of very slight
increased echogenicity of the liver parenchyma that may reflect mild
steatosis.

## 2022-03-05 IMAGING — US US OB < 14 WEEKS - US OB TV
1 series · 15 of 28 positions shown · non-contrast
Comparison: None.

CLINICAL DATA: Abdominal cramping, vaginal bleeding

EXAM:
OBSTETRIC <14 WK US AND TRANSVAGINAL OB US
TECHNIQUE: Both transabdominal and transvaginal ultrasound examinations were
performed for complete evaluation of the gestation as well as the
maternal uterus, adnexal regions, and pelvic cul-de-sac.
Transvaginal technique was performed to assess early pregnancy.

[Series 1: us ob < 14 weeks - us ob tv · 70 acquisitions, 15 frames shown]
[im 1/70]
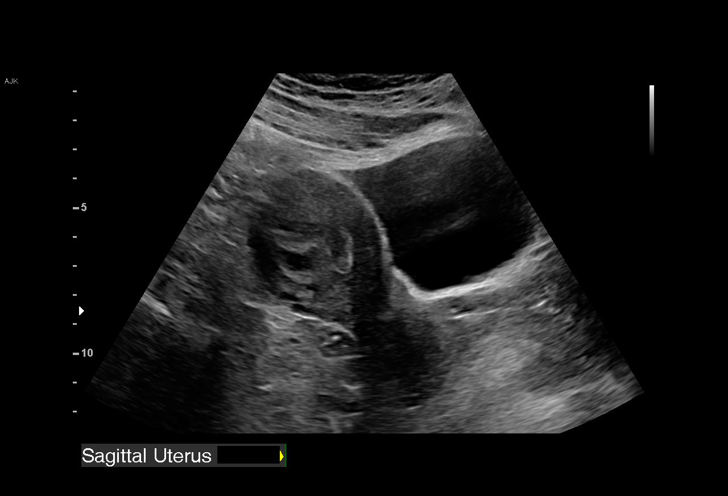
[im 6/70]
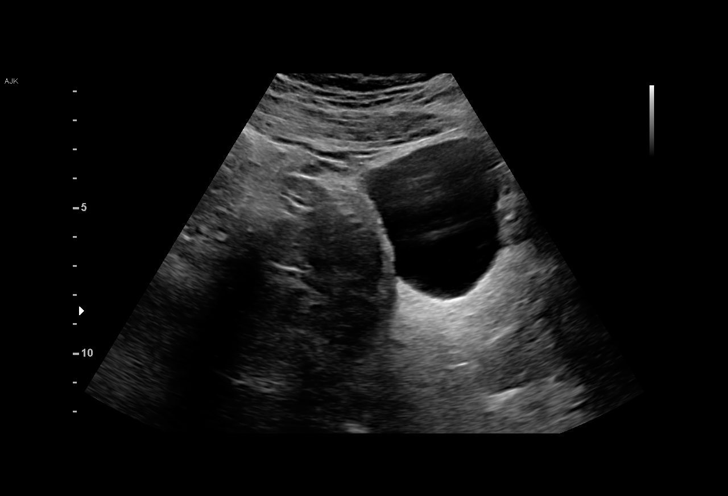
[im 11/70]
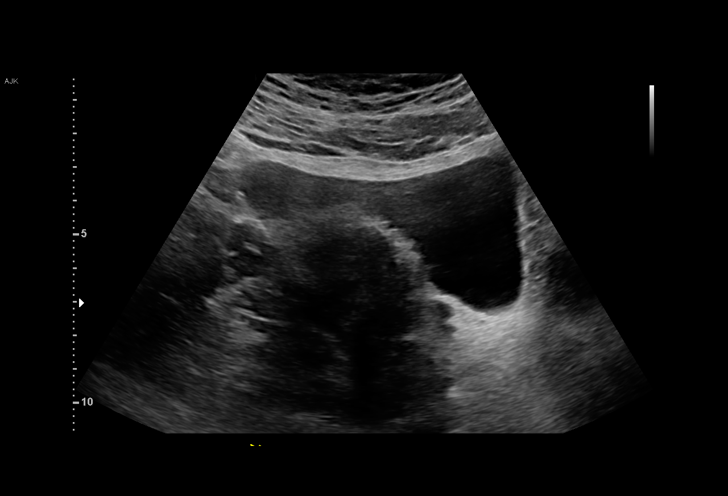
[im 16/70]
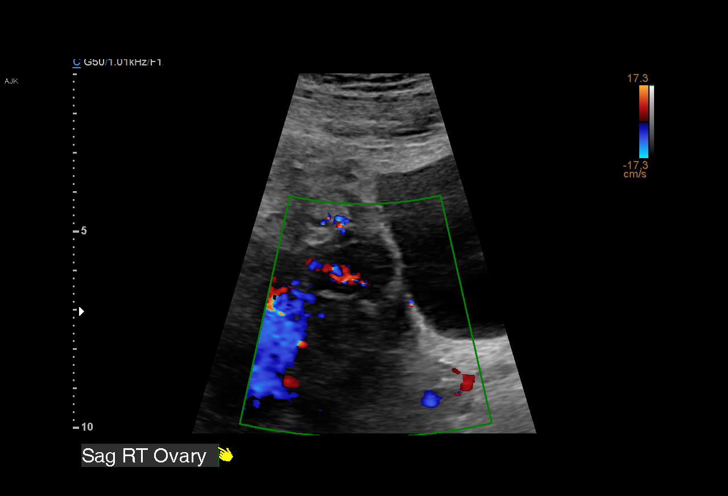
[im 21/70]
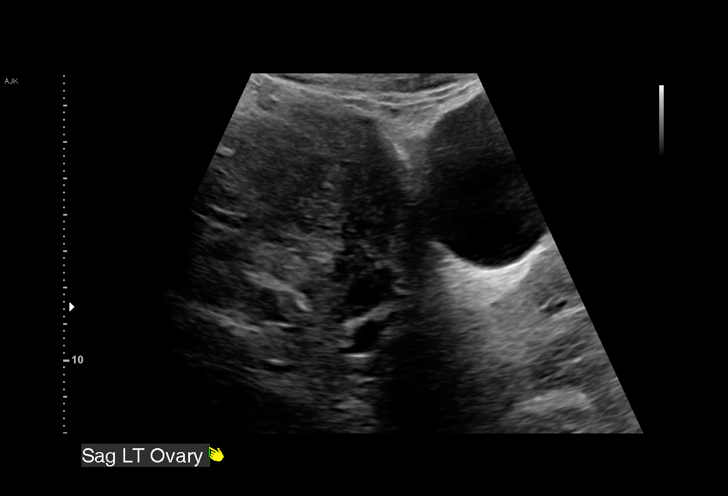
[im 26/70]
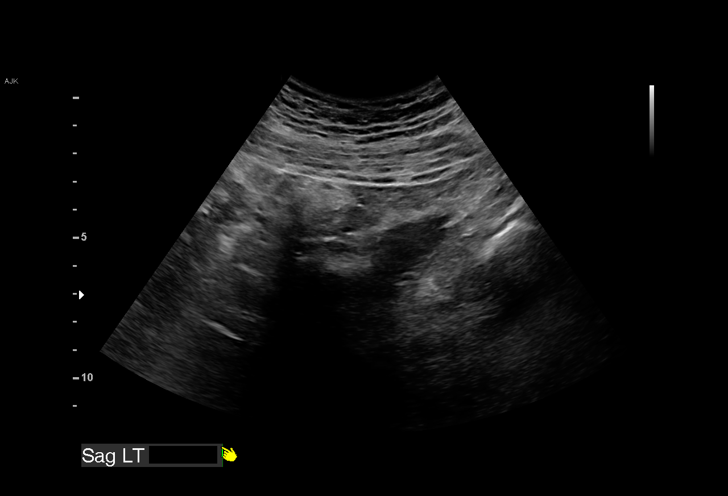
[im 31/70]
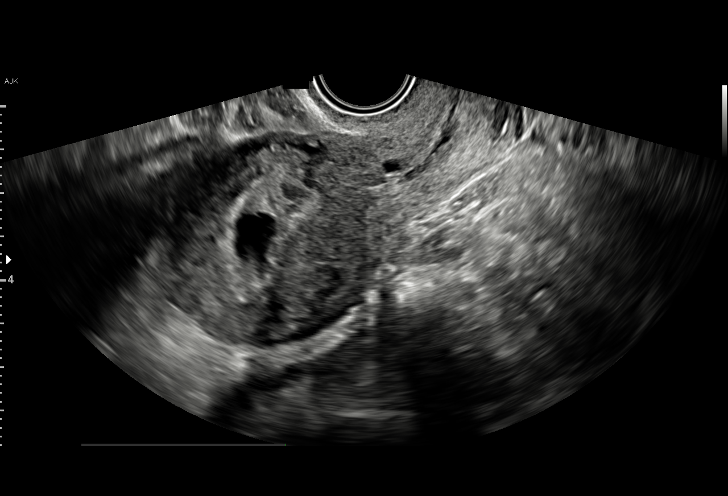
[im 36/70]
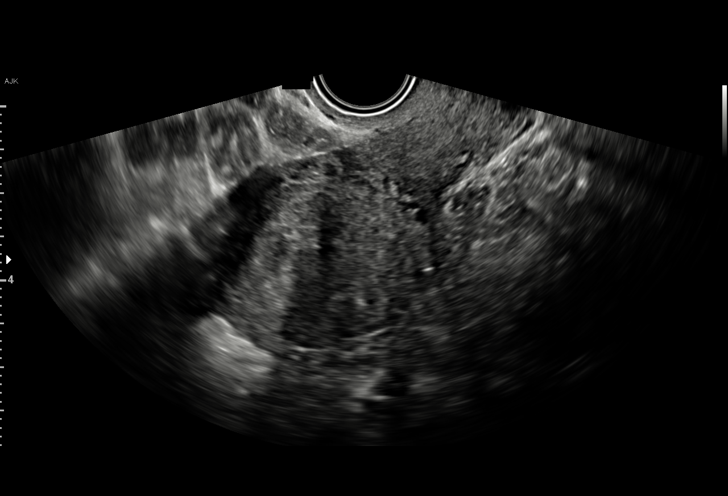
[im 39/70]
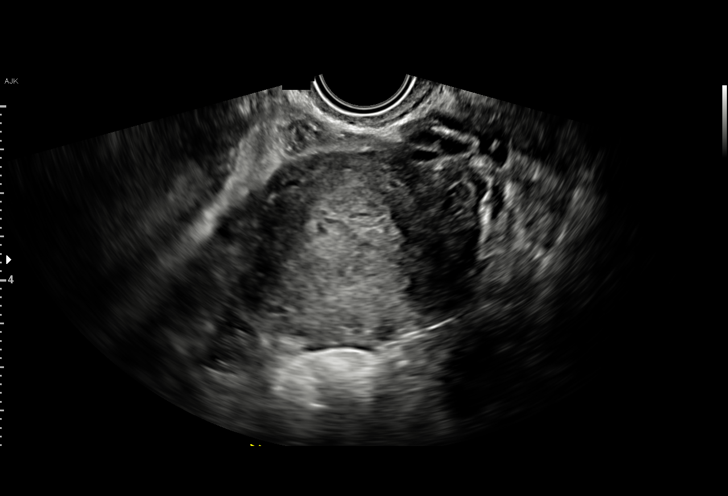
[im 44/70]
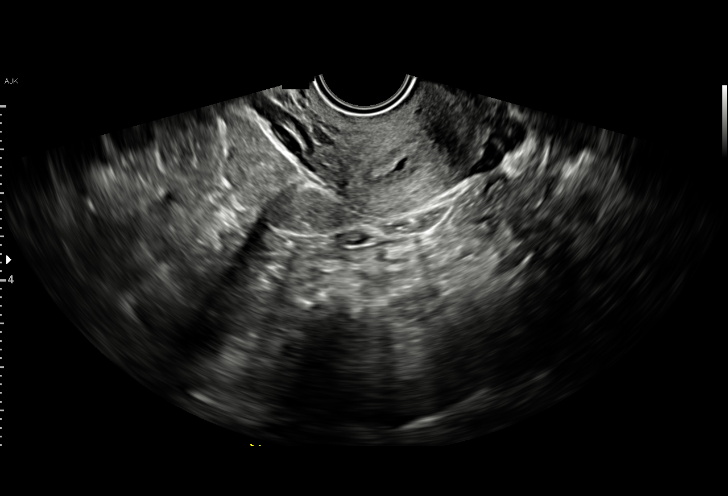
[im 49/70]
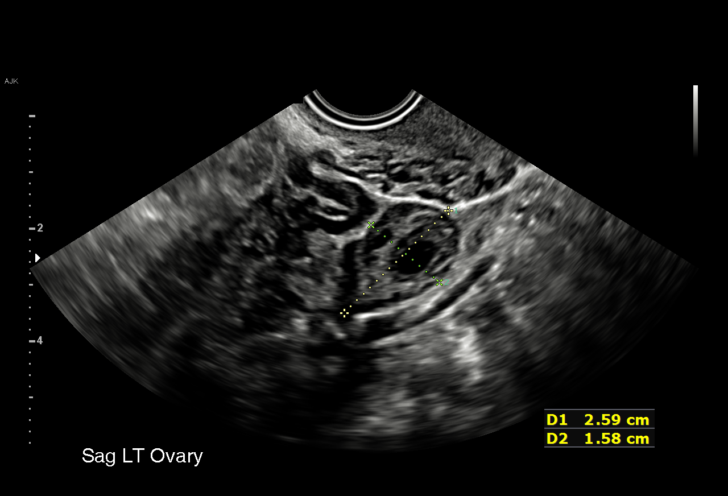
[im 54/70]
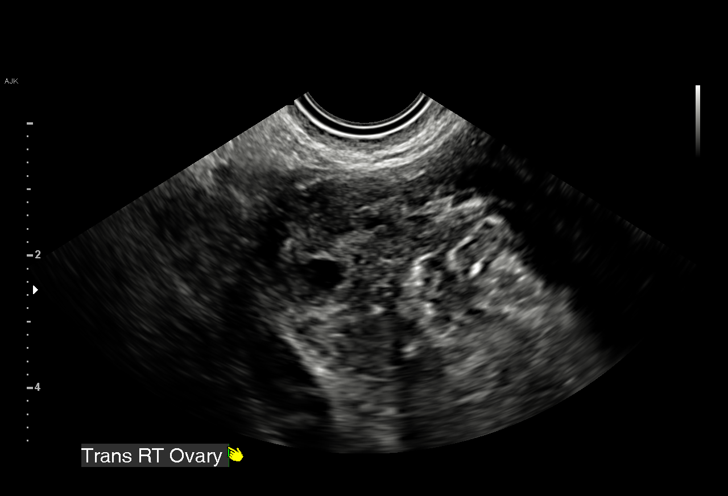
[im 59/70]
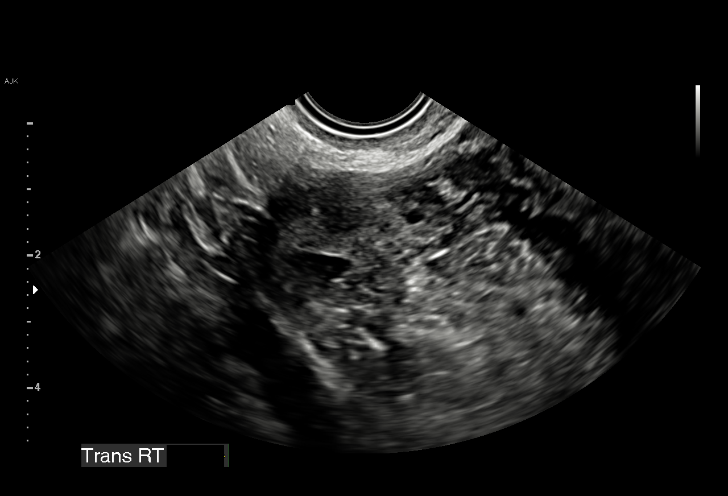
[im 64/70]
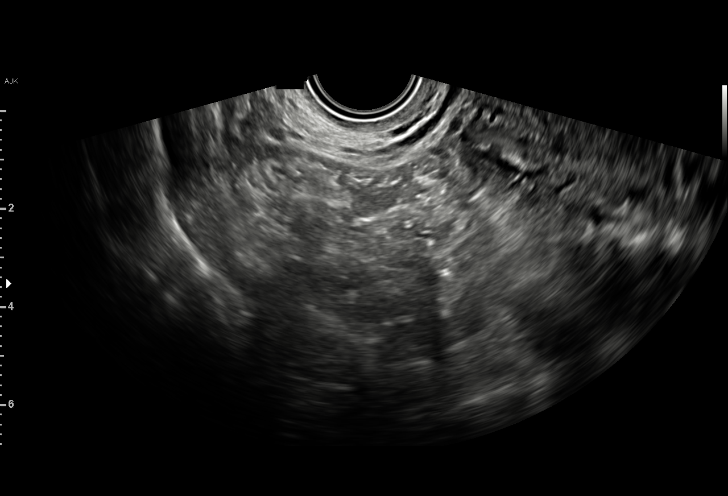
[im 70/70]
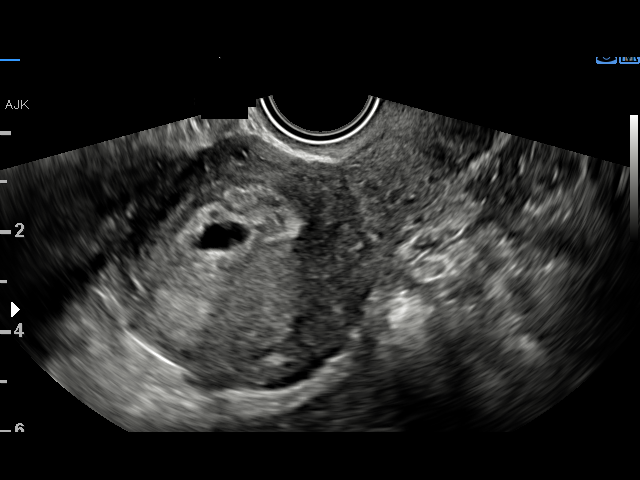

[15 of 28 positions shown; findings below may reference images not displayed]

FINDINGS: Intrauterine gestational sac: Single

Yolk sac:  Not Visualized.

Embryo:  Not Visualized.

Cardiac Activity: Not Visualized.

MSD: 12.4 mm   6 w   0 d

Subchorionic hemorrhage:  None visualized.

Maternal uterus/adnexae: Right ovary measures 2.2 x 2.4 x 1.4 cm and
the left ovary measures 1.9 x 2.6 by 1.6 cm. Corpus luteum cyst
noted within the left ovary. No free fluid.
IMPRESSION: 1. Probable early intrauterine gestational sac with an estimated age
of 6 weeks and 0 days, but no yolk sac, fetal pole, or cardiac
activity yet visualized. Recommend follow-up quantitative B-HCG
levels and follow-up US in 14 days to assess viability. This
recommendation follows SRU consensus guidelines: Diagnostic Criteria
for Nonviable Pregnancy Early in the First Trimester. N Engl J Med
